# Patient Record
Sex: Female | Born: 1975 | Hispanic: Yes | Marital: Married | Smoking: Never smoker
Health system: Southern US, Community
[De-identification: ages and names within clinical notes are randomized; demographics above are authoritative.]

## PROBLEM LIST (undated history)

## (undated) ENCOUNTER — Emergency Department (HOSPITAL_BASED_OUTPATIENT_CLINIC_OR_DEPARTMENT_OTHER): Payer: PRIVATE HEALTH INSURANCE | Source: Home / Self Care

## (undated) DIAGNOSIS — Z862 Personal history of diseases of the blood and blood-forming organs and certain disorders involving the immune mechanism: Secondary | ICD-10-CM

## (undated) DIAGNOSIS — J302 Other seasonal allergic rhinitis: Secondary | ICD-10-CM

## (undated) DIAGNOSIS — Z98811 Dental restoration status: Secondary | ICD-10-CM

## (undated) DIAGNOSIS — H5010 Unspecified exotropia: Secondary | ICD-10-CM

## (undated) HISTORY — PX: OVARIAN CYST SURGERY: SHX726

## (undated) HISTORY — PX: ORIF FEMUR FRACTURE: SHX2119

---

## 2013-04-16 DIAGNOSIS — H5010 Unspecified exotropia: Secondary | ICD-10-CM

## 2013-04-16 HISTORY — DX: Unspecified exotropia: H50.10

## 2013-05-02 ENCOUNTER — Encounter (HOSPITAL_BASED_OUTPATIENT_CLINIC_OR_DEPARTMENT_OTHER): Payer: Self-pay | Admitting: *Deleted

## 2013-05-04 NOTE — H&P (Signed)
  Date of examination:  04-13-13  Indication for surgery: To straighten the eyes and allow some binocularity, and excisional biopsy RLL lesion  Pertinent past medical history:  Past Medical History  Diagnosis Date  . Seasonal allergies   . 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 History of anemia     no current med.  . Dental crowns present     Pertinent ocular history:  XT since childhood.  RLL lesion ? duration  Pertinent family history: History reviewed. No pertinent family history.  General:  Healthy appearing patient in no distress.    Eyes:    Acuity cc OD 20/25  OS 20/20  External: Within normal limits x nonpigmented lesion RLL  Anterior segment: Within normal limits     Motility:   XT = 50 comitant.  XT' 50.  Rots nl  Fundus: deferred  Refraction:   Manifest  OD -3 approx  OS -1.50 approx  Heart: Regular rate and rhythm without murmur     Lungs: Clear to auscultation     Abdomen: Soft, nontender, normal bowel sounds     Impression:1. Exotropia  2. Nonpigmented RLL lesion, R/O BCC  Plan: 1. LR recess OU  2. Excisional bx RLL lesion.  Explained some lashes may be lost permanently  Tera Pellicane O

## 2013-05-06 ENCOUNTER — Encounter (HOSPITAL_BASED_OUTPATIENT_CLINIC_OR_DEPARTMENT_OTHER): Payer: Self-pay | Admitting: *Deleted

## 2013-05-06 ENCOUNTER — Encounter (HOSPITAL_BASED_OUTPATIENT_CLINIC_OR_DEPARTMENT_OTHER): Payer: PRIVATE HEALTH INSURANCE | Admitting: Anesthesiology

## 2013-05-06 ENCOUNTER — Ambulatory Visit (HOSPITAL_BASED_OUTPATIENT_CLINIC_OR_DEPARTMENT_OTHER): Payer: PRIVATE HEALTH INSURANCE | Admitting: Anesthesiology

## 2013-05-06 ENCOUNTER — Encounter (HOSPITAL_BASED_OUTPATIENT_CLINIC_OR_DEPARTMENT_OTHER)
Admission: RE | Disposition: A | Discharge status: Home / Self Care | Payer: Self-pay | Source: Ambulatory Visit | Attending: Ophthalmology

## 2013-05-06 ENCOUNTER — Ambulatory Visit (HOSPITAL_BASED_OUTPATIENT_CLINIC_OR_DEPARTMENT_OTHER)
Admission: RE | Admit: 2013-05-06 | Discharge: 2013-05-06 | Disposition: A | Discharge status: Home / Self Care | Payer: PRIVATE HEALTH INSURANCE | Source: Ambulatory Visit | Attending: Ophthalmology | Admitting: Ophthalmology

## 2013-05-06 DIAGNOSIS — D2310 Other benign neoplasm of skin of unspecified eyelid, including canthus: Secondary | ICD-10-CM | POA: Insufficient documentation

## 2013-05-06 DIAGNOSIS — H5010 Unspecified exotropia: Secondary | ICD-10-CM | POA: Insufficient documentation

## 2013-05-06 HISTORY — DX: Personal history of diseases of the blood and blood-forming organs and certain disorders involving the immune mechanism: Z86.2

## 2013-05-06 HISTORY — DX: Unspecified exotropia: H50.10

## 2013-05-06 HISTORY — DX: Other seasonal allergic rhinitis: J30.2

## 2013-05-06 HISTORY — DX: Dental restoration status: Z98.811

## 2013-05-06 HISTORY — PX: LID LESION EXCISION: SHX5204

## 2013-05-06 HISTORY — PX: STRABISMUS SURGERY: SHX218

## 2013-05-06 LAB — POCT HEMOGLOBIN-HEMACUE: Hemoglobin: 8.6 g/dL — ABNORMAL LOW (ref 12.0–15.0)

## 2013-05-06 SURGERY — STRABISMUS SURGERY, BILATERAL
Anesthesia: General | Site: Eye | Laterality: Right | Wound class: Clean

## 2013-05-06 MED ORDER — LACTATED RINGERS IV SOLN
INTRAVENOUS | Status: DC
  Administered 2013-05-06: 09:00:00 via INTRAVENOUS

## 2013-05-06 MED ORDER — OXYCODONE HCL 5 MG/5ML PO SOLN: 5.0000 mg | Freq: Once | ORAL | Status: DC | PRN

## 2013-05-06 MED ORDER — OXYCODONE-ACETAMINOPHEN 7.5-325 MG PO TABS: 1.0000 | ORAL_TABLET | ORAL | Status: DC | PRN

## 2013-05-06 MED ORDER — FENTANYL CITRATE 0.05 MG/ML IJ SOLN
INTRAMUSCULAR | Status: AC
  Filled 2013-05-06: qty 4

## 2013-05-06 MED ORDER — PROPOFOL 10 MG/ML IV BOLUS
INTRAVENOUS | Status: DC | PRN
  Administered 2013-05-06: 150 mg via INTRAVENOUS

## 2013-05-06 MED ORDER — MIDAZOLAM HCL 5 MG/5ML IJ SOLN
INTRAMUSCULAR | Status: DC | PRN
  Administered 2013-05-06: 2 mg via INTRAVENOUS

## 2013-05-06 MED ORDER — TOBRAMYCIN-DEXAMETHASONE 0.3-0.1 % OP OINT
TOPICAL_OINTMENT | OPHTHALMIC | Status: DC | PRN
  Administered 2013-05-06: 1 via OPHTHALMIC

## 2013-05-06 MED ORDER — MIDAZOLAM HCL 2 MG/2ML IJ SOLN: 1.0000 mg | INTRAMUSCULAR | Status: DC | PRN

## 2013-05-06 MED ORDER — TOBRAMYCIN-DEXAMETHASONE 0.3-0.1 % OP SUSP
OPHTHALMIC | Status: AC
  Filled 2013-05-06: qty 2.5

## 2013-05-06 MED ORDER — MIDAZOLAM HCL 2 MG/2ML IJ SOLN
INTRAMUSCULAR | Status: AC
  Filled 2013-05-06: qty 2

## 2013-05-06 MED ORDER — TOBRAMYCIN-DEXAMETHASONE 0.3-0.1 % OP OINT: 1.0000 "application " | TOPICAL_OINTMENT | Freq: Two times a day (BID) | OPHTHALMIC | Status: DC

## 2013-05-06 MED ORDER — LIDOCAINE HCL (CARDIAC) 20 MG/ML IV SOLN
INTRAVENOUS | Status: DC | PRN
  Administered 2013-05-06: 50 mg via INTRAVENOUS

## 2013-05-06 MED ORDER — OXYCODONE HCL 5 MG PO TABS: 5.0000 mg | ORAL_TABLET | Freq: Once | ORAL | Status: DC | PRN

## 2013-05-06 MED ORDER — FENTANYL CITRATE 0.05 MG/ML IJ SOLN: 50.0000 ug | INTRAMUSCULAR | Status: DC | PRN

## 2013-05-06 MED ORDER — ONDANSETRON HCL 4 MG/2ML IJ SOLN
INTRAMUSCULAR | Status: DC | PRN
  Administered 2013-05-06: 4 mg via INTRAVENOUS

## 2013-05-06 MED ORDER — FENTANYL CITRATE 0.05 MG/ML IJ SOLN
INTRAMUSCULAR | Status: DC | PRN
  Administered 2013-05-06: 100 ug via INTRAVENOUS

## 2013-05-06 MED ORDER — DEXAMETHASONE SODIUM PHOSPHATE 4 MG/ML IJ SOLN
INTRAMUSCULAR | Status: DC | PRN
  Administered 2013-05-06: 10 mg via INTRAVENOUS

## 2013-05-06 MED ORDER — HYDROMORPHONE HCL PF 1 MG/ML IJ SOLN
INTRAMUSCULAR | Status: AC
  Filled 2013-05-06: qty 1

## 2013-05-06 MED ORDER — HYDROMORPHONE HCL PF 1 MG/ML IJ SOLN
0.2500 mg | INTRAMUSCULAR | Status: DC | PRN
  Administered 2013-05-06: 0.5 mg via INTRAVENOUS
  Administered 2013-05-06: 0.25 mg via INTRAVENOUS

## 2013-05-06 MED ORDER — MEPERIDINE HCL 25 MG/ML IJ SOLN: 6.2500 mg | INTRAMUSCULAR | Status: DC | PRN

## 2013-05-06 MED ORDER — ATROPINE SULFATE 0.4 MG/ML IJ SOLN
INTRAMUSCULAR | Status: DC | PRN
  Administered 2013-05-06: .4 mg via INTRAVENOUS

## 2013-05-06 MED ORDER — KETOROLAC TROMETHAMINE 30 MG/ML IJ SOLN
INTRAMUSCULAR | Status: DC | PRN
  Administered 2013-05-06: 30 mg via INTRAVENOUS

## 2013-05-06 MED ORDER — ONDANSETRON HCL 4 MG/2ML IJ SOLN: 4.0000 mg | Freq: Once | INTRAMUSCULAR | Status: DC | PRN

## 2013-05-06 SURGICAL SUPPLY — 46 items
APPLICATOR COTTON TIP 6IN STRL (MISCELLANEOUS) ×12 IMPLANT
APPLICATOR DR MATTHEWS STRL (MISCELLANEOUS) ×3 IMPLANT
BANDAGE ADHESIVE 1X3 (GAUZE/BANDAGES/DRESSINGS) ×3 IMPLANT
BANDAGE COBAN STERILE 2 (GAUZE/BANDAGES/DRESSINGS) ×3 IMPLANT
BLADE SURG 15 STRL LF DISP TIS (BLADE) IMPLANT
BLADE SURG 15 STRL SS (BLADE)
CAUTERY EYE LOW TEMP 1300F FIN (OPHTHALMIC RELATED) ×3 IMPLANT
COVER MAYO STAND STRL (DRAPES) ×3 IMPLANT
COVER TABLE BACK 60X90 (DRAPES) ×3 IMPLANT
DECANTER SPIKE VIAL GLASS SM (MISCELLANEOUS) ×3 IMPLANT
DRAPE SURG 17X23 STRL (DRAPES) ×6 IMPLANT
DRAPE U-SHAPE 76X120 STRL (DRAPES) IMPLANT
ELECT NEEDLE TIP 2.8 STRL (NEEDLE) IMPLANT
ELECT REM PT RETURN 9FT ADLT (ELECTROSURGICAL)
ELECT REM PT RETURN 9FT PED (ELECTROSURGICAL)
ELECTRODE REM PT RETRN 9FT PED (ELECTROSURGICAL) IMPLANT
ELECTRODE REM PT RTRN 9FT ADLT (ELECTROSURGICAL) IMPLANT
GAUZE XEROFORM 1X8 LF (GAUZE/BANDAGES/DRESSINGS) IMPLANT
GLOVE BIO SURGEON STRL SZ 6.5 (GLOVE) ×3 IMPLANT
GLOVE BIOGEL M STRL SZ7.5 (GLOVE) ×6 IMPLANT
GOWN BRE IMP PREV XXLGXLNG (GOWN DISPOSABLE) ×3 IMPLANT
GOWN PREVENTION PLUS XLARGE (GOWN DISPOSABLE) ×3 IMPLANT
NEEDLE 27GAX1X1/2 (NEEDLE) ×3 IMPLANT
NS IRRIG 1000ML POUR BTL (IV SOLUTION) ×3 IMPLANT
PACK BASIN DAY SURGERY FS (CUSTOM PROCEDURE TRAY) ×3 IMPLANT
PAD EYE OVAL STERILE LF (GAUZE/BANDAGES/DRESSINGS) ×3 IMPLANT
PENCIL BUTTON HOLSTER BLD 10FT (ELECTRODE) IMPLANT
SHEET MEDIUM DRAPE 40X70 STRL (DRAPES) ×3 IMPLANT
SPEAR EYE SURG WECK-CEL (MISCELLANEOUS) ×6 IMPLANT
STRIP CLOSURE SKIN 1/4X4 (GAUZE/BANDAGES/DRESSINGS) IMPLANT
SUT 6 0 SILK T G140 8DA (SUTURE) IMPLANT
SUT ETHILON 6 0 P 1 (SUTURE) IMPLANT
SUT MERSILENE 6 0 S14 DA (SUTURE) IMPLANT
SUT PLAIN 6 0 TG1408 (SUTURE) IMPLANT
SUT SILK 4 0 C 3 735G (SUTURE) IMPLANT
SUT SILK 4 0 P 3 (SUTURE) IMPLANT
SUT VIC AB 4-0 P-3 18XBRD (SUTURE) IMPLANT
SUT VIC AB 4-0 P3 18 (SUTURE)
SUT VICRYL 6 0 S 28 (SUTURE) IMPLANT
SUT VICRYL ABS 6-0 S29 18IN (SUTURE) ×6 IMPLANT
SYR 3ML 18GX1 1/2 (SYRINGE) ×3 IMPLANT
SYR CONTROL 10ML LL (SYRINGE) ×3 IMPLANT
SYRINGE 10CC LL (SYRINGE) ×3 IMPLANT
TOWEL OR 17X24 6PK STRL BLUE (TOWEL DISPOSABLE) ×9 IMPLANT
TOWEL OR NON WOVEN STRL DISP B (DISPOSABLE) ×3 IMPLANT
TRAY DSU PREP LF (CUSTOM PROCEDURE TRAY) ×3 IMPLANT

## 2013-05-06 NOTE — Anesthesia Postprocedure Evaluation (Signed)
Anesthesia Post Note  Patient: Kristine Blackwell  Procedure(s) Performed: Procedure(s) (LRB): REPAIR STRABISMUS BILATERAL and excision of lesion right lower lid (Bilateral) LID LESION EXCISION right  (Right)  Anesthesia type: general  Patient location: PACU  Post pain: Pain level controlled  Post assessment: Patient's Cardiovascular Status Stable  Last Vitals:  Filed Vitals:   05/06/13 1155  BP: 132/87  Pulse: 72  Temp: 36.4 C  Resp: 16    Post vital signs: Reviewed and stable  Level of consciousness: sedated  Complications: No apparent anesthesia complications

## 2013-05-06 NOTE — Op Note (Signed)
05/06/2013  10:48 AM  PATIENT:  Kristine Blackwell  37 y.o. female  PRE-OPERATIVE DIAGNOSIS:  1.  Exotropia        2.  Neoplasm, right lower eyelid  POST-OPERATIVE DIAGNOSIS:  same  PROCEDURE: 1.  Lateral rectus muscle recession 9.0 mm both eyes  SURGEON:  Pasty Spillers.Maple Hudson, M.D.   ANESTHESIA:   general  COMPLICATIONS:None  DESCRIPTION OF PROCEDURE: The patient was taken to the operating room where She was identified by me. General anesthesia was induced without difficulty after placement of appropriate monitors. The patient was prepped and draped in standard sterile fashion. A lid speculum was placed in the right eye.  Through an inferotemporal fornix incision through conjunctiva and Tenon's fascia, the right lateral rectus muscle was engaged on a series of muscle hooks and cleared of its fascial attachments. The tendon was secured with a double-armed 6-0 Vicryl suture with a double locking bite at each border of the muscle, 1 mm from the insertion. The muscle was disinserted, and was reattached to sclera at a measured distance of 9.0 millimeters posterior to the original insertion, using direct scleral passes in crossed swords fashion.  The suture ends were tied securely after the position of the muscle had been checked and found to be accurate. Conjunctiva was closed with 2 6-0 Vicryl sutures.  The speculum was transferred to the left eye, where an identical procedure was performed, again effecting a 9.0 millimeters recession of the lateral rectus muscle.  The lesion in the right lower eyelid was inspected.  It was nonpigmented, measuring 4.0 mm horizontally and 2.9 mm vertically, along the lid margin, with several lashes growing out through it.  Its temporal margin was 2.8 mm from the lateral canthus.  It was grasped with 0.5 mm forceps and excised with #15 blade, sparing as many lashes as possible.  Hemostasis was achieved with ophthalmic cautery.  TobraDex ointment was placed in each eye. The  patient was awakened without difficulty and taken to the recovery room in stable condition, having suffered no intraoperative or immediate postoperative complications.  Pasty Spillers. Omarius Grantham M.D.    PATIENT DISPOSITION:  PACU - hemodynamically stable.

## 2013-05-06 NOTE — Anesthesia Preprocedure Evaluation (Signed)

## 2013-05-06 NOTE — Anesthesia Procedure Notes (Signed)
Procedure Name: LMA Insertion Performed by: Elvira Langston W Pre-anesthesia Checklist: Patient identified, Timeout performed, Emergency Drugs available, Suction available and Patient being monitored Patient Re-evaluated:Patient Re-evaluated prior to inductionOxygen Delivery Method: Circle system utilized Preoxygenation: Pre-oxygenation with 100% oxygen Intubation Type: IV induction Ventilation: Mask ventilation without difficulty LMA: LMA flexible inserted LMA Size: 4.0 Number of attempts: 1 Placement Confirmation: breath sounds checked- equal and bilateral and positive ETCO2 Tube secured with: Tape Dental Injury: Teeth and Oropharynx as per pre-operative assessment      

## 2013-05-06 NOTE — Progress Notes (Signed)
Kristine Blackwell here in pre op to interpret

## 2013-05-06 NOTE — Interval H&P Note (Signed)
History and Physical Interval Note:  05/06/2013 9:47 AM  Kristine Blackwell  has presented today for surgery, with the diagnosis of exotropia  The various methods of treatment have been discussed with the patient and family. After consideration of risks, benefits and other options for treatment, the patient has consented to  Procedure(s): REPAIR STRABISMUS BILATERAL and excision of lesion right lower lid (Bilateral) LID LESION EXCISION right  (Right) as a surgical intervention .  The patient's history has been reviewed, patient examined, no change in status, stable for surgery.  I have reviewed the patient's chart and labs.  Questions were answered to the patient's satisfaction.     [name]

## 2013-05-06 NOTE — Transfer of Care (Signed)
Immediate Anesthesia Transfer of Care Note  Patient: Kristine Blackwell  Procedure(s) Performed: Procedure(s) with comments: REPAIR STRABISMUS BILATERAL and excision of lesion right lower lid (Bilateral) LID LESION EXCISION right  (Right) - right lower  lid  Patient Location: PACU  Anesthesia Type:General  Level of Consciousness: awake and sedated  Airway & Oxygen Therapy: Patient Spontanous Breathing and Patient connected to face mask oxygen  Post-op Assessment: Report given to PACU RN and Post -op Vital signs reviewed and stable  Post vital signs: Reviewed and stable  Complications: No apparent anesthesia complications

## 2013-05-09 ENCOUNTER — Encounter (HOSPITAL_BASED_OUTPATIENT_CLINIC_OR_DEPARTMENT_OTHER): Payer: Self-pay | Admitting: Ophthalmology

## 2017-12-02 ENCOUNTER — Encounter (HOSPITAL_COMMUNITY): Payer: Self-pay

## 2017-12-02 ENCOUNTER — Other Ambulatory Visit: Payer: Self-pay

## 2017-12-02 ENCOUNTER — Emergency Department (HOSPITAL_COMMUNITY)
Admission: EM | Admit: 2017-12-02 | Discharge: 2017-12-02 | Disposition: A | Discharge status: Home / Self Care | Payer: No Typology Code available for payment source | Attending: Emergency Medicine | Admitting: Emergency Medicine

## 2017-12-02 ENCOUNTER — Emergency Department (HOSPITAL_COMMUNITY): Payer: No Typology Code available for payment source

## 2017-12-02 DIAGNOSIS — Y939 Activity, unspecified: Secondary | ICD-10-CM | POA: Diagnosis not present

## 2017-12-02 DIAGNOSIS — Y999 Unspecified external cause status: Secondary | ICD-10-CM | POA: Insufficient documentation

## 2017-12-02 DIAGNOSIS — S46812A Strain of other muscles, fascia and tendons at shoulder and upper arm level, left arm, initial encounter: Secondary | ICD-10-CM | POA: Diagnosis not present

## 2017-12-02 DIAGNOSIS — S39012A Strain of muscle, fascia and tendon of lower back, initial encounter: Secondary | ICD-10-CM | POA: Diagnosis not present

## 2017-12-02 DIAGNOSIS — Y929 Unspecified place or not applicable: Secondary | ICD-10-CM | POA: Insufficient documentation

## 2017-12-02 DIAGNOSIS — S299XXA Unspecified injury of thorax, initial encounter: Secondary | ICD-10-CM | POA: Diagnosis present

## 2017-12-02 LAB — POC URINE PREG, ED: Preg Test, Ur: NEGATIVE

## 2017-12-02 MED ORDER — ACETAMINOPHEN 325 MG PO TABS
650.0000 mg | ORAL_TABLET | Freq: Once | ORAL | Status: AC
  Administered 2017-12-02: 650 mg via ORAL
  Filled 2017-12-02: qty 2

## 2017-12-02 MED ORDER — IBUPROFEN 200 MG PO TABS
600.0000 mg | ORAL_TABLET | Freq: Once | ORAL | Status: AC
  Administered 2017-12-02: 600 mg via ORAL
  Filled 2017-12-02: qty 3

## 2017-12-02 NOTE — Discharge Instructions (Signed)
If you develop worsening pain or if you develop weakness or numbness in your arms or legs, or if you develop headache, chest pain, abdominal pain or other new/concerning symptoms then return to the ER for evaluation. Take ibuprofen and/or tylenol for pain.  Si experimenta un empeoramiento del dolor o si presenta debilidad o entumecimiento en los brazos o las piernas, o si presenta dolor de cabeza, dolor en el pecho, dolor abdominal u otros sntomas nuevos o relacionados, vuelva a la sala de emergencias para su evaluacin. Tome ibuprofeno y / o tylenol para Conservation officer, historic buildings.
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
## 2018-04-03 ENCOUNTER — Other Ambulatory Visit: Payer: Self-pay

## 2018-04-03 ENCOUNTER — Emergency Department (HOSPITAL_BASED_OUTPATIENT_CLINIC_OR_DEPARTMENT_OTHER)
Admission: EM | Admit: 2018-04-03 | Discharge: 2018-04-03 | Disposition: A | Discharge status: Home / Self Care | Payer: BLUE CROSS/BLUE SHIELD | Attending: Emergency Medicine | Admitting: Emergency Medicine

## 2018-04-03 ENCOUNTER — Encounter (HOSPITAL_BASED_OUTPATIENT_CLINIC_OR_DEPARTMENT_OTHER): Payer: Self-pay | Admitting: Emergency Medicine

## 2018-04-03 ENCOUNTER — Emergency Department (HOSPITAL_BASED_OUTPATIENT_CLINIC_OR_DEPARTMENT_OTHER): Payer: BLUE CROSS/BLUE SHIELD

## 2018-04-03 DIAGNOSIS — Z79899 Other long term (current) drug therapy: Secondary | ICD-10-CM | POA: Diagnosis not present

## 2018-04-03 DIAGNOSIS — O209 Hemorrhage in early pregnancy, unspecified: Secondary | ICD-10-CM | POA: Insufficient documentation

## 2018-04-03 DIAGNOSIS — D259 Leiomyoma of uterus, unspecified: Secondary | ICD-10-CM | POA: Diagnosis not present

## 2018-04-03 DIAGNOSIS — Z3A01 Less than 8 weeks gestation of pregnancy: Secondary | ICD-10-CM | POA: Insufficient documentation

## 2018-04-03 DIAGNOSIS — R8271 Bacteriuria: Secondary | ICD-10-CM

## 2018-04-03 DIAGNOSIS — Z1389 Encounter for screening for other disorder: Secondary | ICD-10-CM | POA: Diagnosis not present

## 2018-04-03 LAB — WET PREP, GENITAL
Clue Cells Wet Prep HPF POC: NONE SEEN
Sperm: NONE SEEN
Trich, Wet Prep: NONE SEEN
Yeast Wet Prep HPF POC: NONE SEEN

## 2018-04-03 LAB — CBC WITH DIFFERENTIAL/PLATELET
Abs Immature Granulocytes: 0.02 10*3/uL (ref 0.00–0.07)
Basophils Absolute: 0 10*3/uL (ref 0.0–0.1)
Basophils Relative: 1 %
Eosinophils Absolute: 0.2 10*3/uL (ref 0.0–0.5)
Eosinophils Relative: 4 %
HCT: 34.9 % — ABNORMAL LOW (ref 36.0–46.0)
Hemoglobin: 10.6 g/dL — ABNORMAL LOW (ref 12.0–15.0)
Immature Granulocytes: 0 %
Lymphocytes Relative: 25 %
Lymphs Abs: 1.6 10*3/uL (ref 0.7–4.0)
MCH: 24.3 pg — ABNORMAL LOW (ref 26.0–34.0)
MCHC: 30.4 g/dL (ref 30.0–36.0)
MCV: 79.9 fL — AB (ref 80.0–100.0)
Monocytes Absolute: 0.6 10*3/uL (ref 0.1–1.0)
Monocytes Relative: 9 %
Neutro Abs: 4.1 10*3/uL (ref 1.7–7.7)
Neutrophils Relative %: 61 %
PLATELETS: 295 10*3/uL (ref 150–400)
RBC: 4.37 MIL/uL (ref 3.87–5.11)
RDW: 23 % — ABNORMAL HIGH (ref 11.5–15.5)
WBC: 6.6 10*3/uL (ref 4.0–10.5)
nRBC: 0 % (ref 0.0–0.2)

## 2018-04-03 LAB — URINALYSIS, ROUTINE W REFLEX MICROSCOPIC
Bilirubin Urine: NEGATIVE
GLUCOSE, UA: NEGATIVE mg/dL
KETONES UR: NEGATIVE mg/dL
Leukocytes, UA: NEGATIVE
Nitrite: NEGATIVE
PH: 6 (ref 5.0–8.0)
Protein, ur: NEGATIVE mg/dL
Specific Gravity, Urine: 1.02 (ref 1.005–1.030)

## 2018-04-03 LAB — URINALYSIS, MICROSCOPIC (REFLEX)

## 2018-04-03 LAB — HCG, QUANTITATIVE, PREGNANCY: hCG, Beta Chain, Quant, S: 13151 m[IU]/mL — ABNORMAL HIGH (ref <5)

## 2018-04-03 LAB — PREGNANCY, URINE: Preg Test, Ur: POSITIVE — AB

## 2018-04-03 LAB — ABO/RH: ABO/RH(D): O POS

## 2018-04-03 NOTE — ED Provider Notes (Addendum)
[facility] EMERGENCY DEPARTMENT Provider Note   CSN: [id number] Arrival date & time: 04/03/18  1610     History   Chief Complaint Chief Complaint  Patient presents with  . Vaginal Bleeding    HPI Kristine Blackwell is a 42 y.o. female is here for evaluation of vaginal bleeding.  Patient states that she found out she was pregnant October 2 be a urine pregnancy test.  Her LMP was September 2.  This morning she had 2 episodes of pink-tinged secretions when she wiped after urinating.  She is not having a flow or passing clots or tissue.  Associate symptoms include midline thoracic back pain that began this morning as well, intermittent, worse with palpation.  No interventions PTA.  No alleviating or aggravating factors.  She is not on any birth control methods or IUDs.  This is patient's third pregnancy, she had a full-term delivery and a miscarriage.  She denies dysuria, hematuria, abdominal pain, abnormal vaginal discharge, nausea, vomiting, fevers. She has OBGYN and first appointment in November.   HPI  Past Medical History:  Diagnosis Date  . Dental crowns present   . 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 History of anemia    no current med.  . Seasonal allergies     There are no active problems to display for this patient.   Past Surgical History:  Procedure Laterality Date  . LID LESION EXCISION Right 05/06/2013   Procedure: LID LESION EXCISION right ;  Surgeon: Derry Skill, MD;  Location: [facility];  Service: Ophthalmology;  Laterality: Right;  right lower  lid  . ORIF FEMUR FRACTURE Left age 2  . OVARIAN CYST SURGERY  age 32  . STRABISMUS SURGERY Bilateral 05/06/2013   Procedure: REPAIR STRABISMUS BILATERAL and excision of lesion right lower lid;  Surgeon: Derry Skill, MD;  Location: [facility];  Service: Ophthalmology;  Laterality: Bilateral;     OB History    Gravida  1   Para      Term      Preterm      AB      Living        SAB      TAB      Ectopic      Multiple      Live Births               Home Medications    Prior to Admission medications   Medication Sig Start Date End Date Taking? Authorizing Provider  oxyCODONE-acetaminophen (PERCOCET) 7.5-325 MG per tablet Take 1 tablet by mouth every 4 (four) hours as needed for pain. Patient not taking: Reported on 12/02/2017 05/06/13   Everitt Amber, MD  tobramycin-dexamethasone [facility]) ophthalmic ointment Place 1 application into both eyes 2 (two) times daily. Patient not taking: Reported on 12/02/2017 05/06/13   Everitt Amber, MD    Family History History reviewed. No pertinent family history.  Social History Social History   Tobacco Use  . Smoking status: Never Smoker  . Smokeless tobacco: Never Used  Substance Use Topics  . Alcohol use: No  . Drug use: No     Allergies   Other   Review of Systems Review of Systems  Genitourinary: Positive for vaginal bleeding.  Musculoskeletal: Positive for back pain.  All other systems reviewed and are negative.    Physical Exam Updated Vital Signs BP 122/83 (BP Location: Right Arm)   Pulse 66   Temp 98.4  F (36.9 C) (Oral)   Resp 18   Ht 5\' 5"  (1.651 m)   Wt 74.8 kg   LMP 02/22/2018   SpO2 100%   BMI 27.46 kg/m   Physical Exam  Constitutional: She is oriented to person, place, and time. She appears well-developed and well-nourished. No distress.  NAD.  HENT:  Head: Normocephalic and atraumatic.  Right Ear: External ear normal.  Left Ear: External ear normal.  Nose: Nose normal.  Eyes: Conjunctivae and EOM are normal.  Neck: Normal range of motion. Neck supple.  Cardiovascular: Normal rate, regular rhythm and normal heart sounds.  Pulmonary/Chest: Effort normal and breath sounds normal.  Abdominal: Soft. Bowel sounds are normal. There is no tenderness.  No suprapubic or CVA tenderness.  Normal bowel sounds to lower quadrants.  Negative Murphy's and  McBurney's.  Genitourinary:  Genitourinary Comments:  External genitalia normal without lesions.  No groin lymphadenopathy.  Vaginal mucosa and cervix pink without lesions.  Scant amount of bright red blood in vaginal blood.  No CMT. Non palpable non tender adnexa. RN in room assisting with exam   Musculoskeletal: Normal range of motion. She exhibits tenderness. She exhibits no deformity.  T-spine: Mild left-sided paraspinal muscle tenderness.  No midline tenderness. L-spine: No midline or paraspinous muscle tenderness.  Neurological: She is alert and oriented to person, place, and time.  Skin: Skin is warm and dry. Capillary refill takes less than 2 seconds.  Psychiatric: She has a normal mood and affect. Her behavior is normal. Judgment and thought content normal.  Nursing note and vitals reviewed.    ED Treatments / Results  Labs (all labs ordered are listed, but only abnormal results are displayed) Labs Reviewed  WET PREP, GENITAL - Abnormal; Notable for the following components:      Result Value   WBC, Wet Prep HPF POC MANY (*)    All other components within normal limits  PREGNANCY, URINE - Abnormal; Notable for the following components:   Preg Test, Ur POSITIVE (*)    All other components within normal limits  URINALYSIS, ROUTINE W REFLEX MICROSCOPIC - Abnormal; Notable for the following components:   Color, Urine STRAW (*)    Hgb urine dipstick LARGE (*)    All other components within normal limits  URINALYSIS, MICROSCOPIC (REFLEX) - Abnormal; Notable for the following components:   Bacteria, UA FEW (*)    All other components within normal limits  CBC WITH DIFFERENTIAL/PLATELET - Abnormal; Notable for the following components:   Hemoglobin 10.6 (*)    HCT 34.9 (*)    MCV 79.9 (*)    MCH 24.3 (*)    RDW 23.0 (*)    All other components within normal limits  HCG, QUANTITATIVE, PREGNANCY - Abnormal; Notable for the following components:   hCG, Beta Chain, Quant, S  13,151 (*)    All other components within normal limits  ABO/RH  GC/CHLAMYDIA PROBE AMP ([address]) NOT AT [facility]    EKG None  Radiology US Ob Comp < 14 Wks  Result Date: 04/03/2018 CLINICAL DATA:  Mid lower back pain and vaginal spotting today. High risk pregnancy due to advanced maternal age. EXAM: OBSTETRIC <14 WK [place] AND TRANSVAGINAL OB US TECHNIQUE: Both transabdominal and transvaginal ultrasound examinations were performed for complete evaluation of the gestation as well as the maternal uterus, adnexal regions, and pelvic cul-de-sac. Transvaginal technique was performed to assess early pregnancy. COMPARISON:  None. FINDINGS: Intrauterine gestational sac: Single Yolk sac:  Visualized. Embryo:  Visualized. Cardiac Activity: Visualized. Heart Rate: 95 bpm CRL:  5.4 mm   6 w   2 d                  [place] EDC: 11/25/2018 Subchorionic hemorrhage: Small to moderate crescentic perigestational hematoma measuring 1.3 x 0.4 x 1.1 cm. Maternal uterus/adnexae: Hypoechoic left-sided uterine body mass measuring 1.6 x 1.1 x 1.1 cm may represent a left-sided fibroid. IMPRESSION: Viable 6 week 2 day gestation with ultrasound EDC of 11/25/2018. Adjacent perigestational hematoma measuring approximately 1.3 x 0.4 x 1.1 cm. Fibroid uterus. Electronically Signed   By: Ashley Royalty M.D.   On: 04/03/2018 19:47   US Ob Transvaginal  Result Date: 04/03/2018 CLINICAL DATA:  Mid lower back pain and vaginal spotting today. High risk pregnancy due to advanced maternal age. EXAM: OBSTETRIC <14 WK [place] AND TRANSVAGINAL OB US TECHNIQUE: Both transabdominal and transvaginal ultrasound examinations were performed for complete evaluation of the gestation as well as the maternal uterus, adnexal regions, and pelvic cul-de-sac. Transvaginal technique was performed to assess early pregnancy. COMPARISON:  None. FINDINGS: Intrauterine gestational sac: Single Yolk sac:  Visualized. Embryo:  Visualized. Cardiac Activity: Visualized. Heart Rate:  95 bpm CRL:  5.4 mm   6 w   2 d                  [place] EDC: 11/25/2018 Subchorionic hemorrhage: Small to moderate crescentic perigestational hematoma measuring 1.3 x 0.4 x 1.1 cm. Maternal uterus/adnexae: Hypoechoic left-sided uterine body mass measuring 1.6 x 1.1 x 1.1 cm may represent a left-sided fibroid. IMPRESSION: Viable 6 week 2 day gestation with ultrasound EDC of 11/25/2018. Adjacent perigestational hematoma measuring approximately 1.3 x 0.4 x 1.1 cm. Fibroid uterus. Electronically Signed   By: Ashley Royalty M.D.   On: 04/03/2018 19:47    Procedures Procedures (including critical care time)  Medications Ordered in ED Medications - No data to display   Initial Impression / Assessment and Plan / ED Course  I have reviewed the triage vital signs and the nursing notes.  Pertinent labs & imaging results that were available during my care of the patient were reviewed by me and considered in my medical decision making (see chart for details).  Clinical Course as of Apr 04 2027  Sat Apr 03, 2018  2008 IMPRESSION: Viable 6 week 2 day gestation with ultrasound EDC of 11/25/2018. Adjacent perigestational hematoma measuring approximately 1.3 x 0.4 x 1.1 cm. Fibroid uterus.    US OB Comp < 14 Wks [CG]    Clinical Course User Index [CG] Kinnie Feil, PA-C    Concern for miscarriage vs ectopic.  Pt is high risk pregnancy given age and previous h/o miscarriage. No h/o ectopic. No current birth control methods or IUD. She denies urinary symptoms, vaginal discharge, fevers, chills, abdominal pain making PID unlikely.  Negative murphys and mcburneys. Low risk sexual practices, given that she is asymptomatic will defer empiric std tx today.   2015: [place] confirms viable single IUP with adjacent hematoma.  H/o anemia with stable hgb,she is having low volume vaginal bleeding and HD stable. UA w/ few bacteria and WBC, will tx for symptomatic bacteriuria.  ABO/Rh just added, carrier in ER but pt needs  to leave. I will f/u on Rh status, if negative will call pt back and let her know she needs rhogam. Pt understands and is comfortable with this plan. First trimester pregnancy education provided and given. Discussed return precautions. She is to  f/u with OBGYN in 1 week.    Final Clinical Impressions(s) / ED Diagnoses   Final diagnoses:  First trimester bleeding  Uterine leiomyoma, unspecified location  Asymptomatic bacteriuria    ED Discharge Orders    None       Arlean Hopping 04/03/18 2028    Elnora Morrison, MD 04/03/18 2221  Addendum: Review of chart and AVS, did not send rx for asymptomatic bacteriuria. Attempted to send rx to pharmacy but unable to. Will contact patient tomorrow and call in rx.     Kinnie Feil, PA-C 04/04/18 1011    Elnora Morrison, MD 04/05/18 1036

## 2018-04-03 NOTE — ED Triage Notes (Signed)
Patient states she had her last period sept 9th - reports tat she took an OTC pregnancy test. The patient states that she is having some minor vaginal bleeding starting today

## 2018-04-03 NOTE — Discharge Instructions (Signed)
You were seen in the ER for vaginal bleeding. Ultrasound confirmed single viable pregnancy of approximate 6 weeks 2 days gestational age.  There is a small hematoma near the pregnancy.  You have one small fibroid. Avoid sexual intercourse until you are seen by your OBGYN.  Return to the ER if there is pelvic pain or abdominal pain, heavy vaginal bleeding, urinary symptoms, fevers, chills.  Ultrasonido confirm [employer] viable de aproximadamente 6 semanas 2 das de edad gestacional.  Hay un pequeo hematoma cerca del embarazo.  Tiene un fibroma pequeo. Evite relaciones sexuales hasta que su OBGYN la vea.  Regrese a Designer, fashion/clothing si hay dolor plvico o abdominal, sangrado vaginal abundante, sntomas urinarios, fiebres, escalofros.  Lista de medicinas que puede tomar durante embarazo para acido reflujo (heartburn) Aluminum hydroxide/magnesium carbonate (Gaviscon)* Famotidine (Pepcid AC) Aluminum hydroxide/magnesium hydroxide (Maalox) Calcium carbonate/magnesium carbonate (Mylanta) Calcium carbonate (Titralac, Tums) Ranitidine (Zantac)

## 2018-04-03 NOTE — ED Notes (Addendum)
Pt states that she is experiencing a "heartburn discomfort in stomach. It hurts in the middle and into my middle back. Pt states she has taken no medication. Pt states that she took a urine test at dr on Oct 2 or 3, 2019 and it was positive. SHe is now complaining of vaginal bleeding, and it is a light pink color.

## 2018-04-03 NOTE — ED Notes (Signed)
Patient transported to Ultrasound 

## 2018-04-03 NOTE — ED Notes (Signed)
ED Provider at bedside. 

## 2018-04-04 ENCOUNTER — Telehealth (HOSPITAL_BASED_OUTPATIENT_CLINIC_OR_DEPARTMENT_OTHER): Payer: Self-pay | Admitting: Emergency Medicine

## 2018-04-04 MED ORDER — NITROFURANTOIN MONOHYD MACRO 100 MG PO CAPS: 100.0000 mg | ORAL_CAPSULE | Freq: Two times a day (BID) | ORAL | 0 refills | Status: DC

## 2018-04-04 NOTE — Telephone Encounter (Signed)
Review of chart and AVS, rx for macrobid not sent to pharmacy prior discharge yesterday. Will call patient to notify and call rx in to pharmacy.

## 2018-04-05 LAB — GC/CHLAMYDIA PROBE AMP (~~LOC~~) NOT AT ARMC
CHLAMYDIA, DNA PROBE: NEGATIVE
Neisseria Gonorrhea: NEGATIVE

## 2018-07-15 ENCOUNTER — Emergency Department (HOSPITAL_COMMUNITY)
Admission: EM | Admit: 2018-07-15 | Discharge: 2018-07-15 | Disposition: A | Discharge status: Home / Self Care | Payer: BLUE CROSS/BLUE SHIELD | Attending: Emergency Medicine | Admitting: Emergency Medicine

## 2018-07-15 ENCOUNTER — Other Ambulatory Visit: Payer: Self-pay

## 2018-07-15 DIAGNOSIS — J45909 Unspecified asthma, uncomplicated: Secondary | ICD-10-CM | POA: Insufficient documentation

## 2018-07-15 DIAGNOSIS — R0602 Shortness of breath: Secondary | ICD-10-CM | POA: Diagnosis present

## 2018-07-15 MED ORDER — ALBUTEROL SULFATE (2.5 MG/3ML) 0.083% IN NEBU
5.0000 mg | INHALATION_SOLUTION | Freq: Once | RESPIRATORY_TRACT | Status: AC
  Administered 2018-07-15: 5 mg via RESPIRATORY_TRACT
  Filled 2018-07-15: qty 6

## 2018-07-15 MED ORDER — ALBUTEROL SULFATE HFA 108 (90 BASE) MCG/ACT IN AERS: 1.0000 | INHALATION_SPRAY | Freq: Four times a day (QID) | RESPIRATORY_TRACT | 0 refills | Status: DC | PRN

## 2018-07-15 NOTE — ED Notes (Signed)
Patient verbalizes understanding of discharge instructions. Opportunity for questioning and answers were provided. Armband removed by staff, pt discharged from ED. Interpreter used.

## 2018-07-15 NOTE — ED Provider Notes (Signed)
[facility] EMERGENCY DEPARTMENT Provider Note   CSN: [id number] Arrival date & time: 07/15/18  1558     History   Chief Complaint Chief Complaint  Patient presents with  . Asthma    HPI Kristine Blackwell is a 43 y.o. female with a PMH of asthma and environmental allergies presenting with constant shortness of breath onset yesterday after cleaning and vacuuming her house. Patient reports there was a lot of dust. Patient states she took allegra with some relief. Patient states she has had a dry cough since yesterday. Patient reports mild congestion. Patient reports associated intermittent bilateral thoracic back pain. Patient denies fever, chills, night sweats, chest pain, or trauma. Patient states she does not have an albuterol inhaler at home. Patient denies recent surgery, hx DVT/PE, recent travel, leg edema/pain. Denies numbness, tingling, weakness, incontinence to bowel/bladder, fever, chills, IV drug use, or hx of cancer. Patient denies sick contacts.   HPI  Past Medical History:  Diagnosis Date  . Dental crowns present   . 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 History of anemia    no current med.  . Seasonal allergies     There are no active problems to display for this patient.   Past Surgical History:  Procedure Laterality Date  . LID LESION EXCISION Right 05/06/2013   Procedure: LID LESION EXCISION right ;  Surgeon: Derry Skill, MD;  Location: [facility];  Service: Ophthalmology;  Laterality: Right;  right lower  lid  . ORIF FEMUR FRACTURE Left age 19  . OVARIAN CYST SURGERY  age 15  . STRABISMUS SURGERY Bilateral 05/06/2013   Procedure: REPAIR STRABISMUS BILATERAL and excision of lesion right lower lid;  Surgeon: Derry Skill, MD;  Location: [facility];  Service: Ophthalmology;  Laterality: Bilateral;     OB History    Gravida  1   Para      Term      Preterm      AB      Living        SAB      TAB        Ectopic      Multiple      Live Births               Home Medications    Prior to Admission medications   Medication Sig Start Date End Date Taking? Authorizing Provider  albuterol (PROVENTIL HFA;VENTOLIN HFA) 108 (90 Base) MCG/ACT inhaler Inhale 1-2 puffs into the lungs every 6 (six) hours as needed for wheezing or shortness of breath. 07/15/18   Darlin Drop P, PA-C  nitrofurantoin, macrocrystal-monohydrate, (MACROBID) 100 MG capsule Take 1 capsule (100 mg total) by mouth 2 (two) times daily. 04/04/18   Kinnie Feil, PA-C  oxyCODONE-acetaminophen (PERCOCET) 7.5-325 MG per tablet Take 1 tablet by mouth every 4 (four) hours as needed for pain. Patient not taking: Reported on 12/02/2017 05/06/13   Everitt Amber, MD  tobramycin-dexamethasone [facility]) ophthalmic ointment Place 1 application into both eyes 2 (two) times daily. Patient not taking: Reported on 12/02/2017 05/06/13   Everitt Amber, MD    Family History No family history on file.  Social History Social History   Tobacco Use  . Smoking status: Never Smoker  . Smokeless tobacco: Never Used  Substance Use Topics  . Alcohol use: No  . Drug use: No     Allergies   Other   Review of Systems Review of Systems  Constitutional: Negative for chills, diaphoresis, fatigue, fever and unexpected weight change.  HENT: Positive for congestion. Negative for rhinorrhea, sore throat and trouble swallowing.   Eyes: Negative for visual disturbance.  Respiratory: Positive for shortness of breath. Negative for cough, chest tightness, wheezing and stridor.   Cardiovascular: Negative for chest pain, palpitations and leg swelling.  Gastrointestinal: Negative for abdominal pain, nausea and vomiting.  Endocrine: Negative for cold intolerance and heat intolerance.  Genitourinary: Negative for dysuria.  Musculoskeletal: Positive for back pain. Negative for neck pain.  Skin: Negative for pallor and rash.   Allergic/Immunologic: Positive for environmental allergies. Negative for food allergies.  Neurological: Negative for dizziness, syncope, speech difficulty, weakness, light-headedness and numbness.  Psychiatric/Behavioral: The patient is not nervous/anxious.      Physical Exam Updated Vital Signs BP 122/86 (BP Location: Right Arm)   Pulse 81   Temp 98.4 F (36.9 C) (Oral)   Resp 19   Ht 5\' 5"  (1.651 m)   Wt 81.6 kg   LMP 02/22/2018   SpO2 96%   BMI 29.95 kg/m   Physical Exam Vitals signs and nursing note reviewed.  Constitutional:      General: She is not in acute distress.    Appearance: She is well-developed. She is not diaphoretic.  HENT:     Head: Normocephalic and atraumatic.     Right Ear: Tympanic membrane, ear canal and external ear normal.     Left Ear: Tympanic membrane, ear canal and external ear normal.     Nose: Nose normal. No congestion or rhinorrhea.     Mouth/Throat:     Mouth: Mucous membranes are moist.     Pharynx: No oropharyngeal exudate or posterior oropharyngeal erythema.  Eyes:     Conjunctiva/sclera: Conjunctivae normal.  Neck:     Musculoskeletal: Normal range of motion and neck supple.     Vascular: No JVD.  Cardiovascular:     Rate and Rhythm: Normal rate and regular rhythm.     Pulses: Normal pulses.          Radial pulses are 2+ on the right side and 2+ on the left side.       Dorsalis pedis pulses are 2+ on the right side and 2+ on the left side.     Heart sounds: Normal heart sounds. No murmur. No friction rub. No gallop.   Pulmonary:     Effort: Pulmonary effort is normal. No accessory muscle usage or respiratory distress.     Breath sounds: Normal breath sounds. No wheezing, rhonchi or rales.  Chest:     Chest wall: No tenderness.  Abdominal:     Palpations: Abdomen is soft.     Tenderness: There is no abdominal tenderness.  Musculoskeletal: Normal range of motion.     Cervical back: Normal. She exhibits normal range of motion,  no tenderness and no bony tenderness.     Thoracic back: Normal. She exhibits normal range of motion, no tenderness and no bony tenderness.     Lumbar back: Normal. She exhibits normal range of motion, no tenderness and no bony tenderness.     Comments: No skin changes noted. No midline tenderness to palpation of cervical, thoracic, or lumbar spine. Mild bilateral paraspinal tenderness to palpation of thoracic back. No paraspinal tenderness of lumbar or cervical spine. Full ROM without difficulty. Sensation intact. 5/5 strength in upper and lower extremities. Patient is able to ambulate without difficulty.  Skin:    General: Skin is warm.  Capillary Refill: Capillary refill takes less than 2 seconds.     Coloration: Skin is not pale.     Findings: No rash.  Neurological:     Mental Status: She is alert and oriented to person, place, and time.     ED Treatments / Results  Labs (all labs ordered are listed, but only abnormal results are displayed) Labs Reviewed - No data to display  EKG None  Radiology No results found.  Procedures Procedures (including critical care time)  Medications Ordered in ED Medications  albuterol (PROVENTIL) (2.5 MG/3ML) 0.083% nebulizer solution 5 mg (5 mg Nebulization Given 07/15/18 1659)     Initial Impression / Assessment and Plan / ED Course  I have reviewed the triage vital signs and the nursing notes.  Pertinent labs & imaging results that were available during my care of the patient were reviewed by me and considered in my medical decision making (see chart for details).    Patient presents with shortness of breath. Patient has been in no acute distress while in the ER. Patient was given albuterol with resolution of symptoms. Patient has been afebrile with cough for 2 days, do not suspect pneumonia at this time. Patient is also PERC negative. Suspect symptoms are likely due to an exacerbation of asthma due to the trigger of cleaning agents and  dust. Patient is at baseline after albuterol treatment. Patient also reports back pain.  No neurological deficits and normal neuro exam.  Patient can walk without difficulty.  No loss of bowel or bladder control.  No concern for cauda equina.  No fever, night sweats, weight loss, h/o cancer, IVDU.  Suspect back pain is likely musculoskeletal due to cleaning yesterday. RICE protocol and tylenol as needed discussed with patient. Discussed obtaining CXR and patient refuses at this time. Patient states she will return if symptoms worsen. Will prescribe albuterol.  Discussed return precautions with patient. Advised patient to follow up with PCP in 2 days. Patient states she understands and agrees with plan.   Final Clinical Impressions(s) / ED Diagnoses   Final diagnoses:  Shortness of breath    ED Discharge Orders         Ordered    albuterol (PROVENTIL HFA;VENTOLIN HFA) 108 (90 Base) MCG/ACT inhaler  Every 6 hours PRN     07/15/18 1749           Arville Lime, [place] 07/15/18 1752    Tegeler, Gwenyth Allegra, MD 07/15/18 [phone]

## 2018-07-15 NOTE — Discharge Instructions (Addendum)
You have been seen today for shortness of breath. Please read and follow all provided instructions.   1. Medications: albuterol for shortness of breath, usual home medications 2. Treatment: rest, drink plenty of fluids 3. Follow Up: Please follow up with your primary doctor in 2 days for discussion of your diagnoses and further evaluation after today's visit; if you do not have a primary care doctor use the resource guide provided to find one; Please return to the ER for any new or worsening symptoms. Please obtain all of your results from medical records or have your doctors office obtain the results - share them with your doctor - you should be seen at your doctors office. Call today to arrange your follow up.   Take medications as prescribed. Please review all of the medicines and only take them if you do not have an allergy to them. Return to the emergency room for worsening condition or new concerning symptoms. Follow up with your regular doctor. If you don't have a regular doctor use one of the numbers below to establish a primary care doctor.  Please be aware that if you are taking birth control pills, taking other prescriptions, ESPECIALLY ANTIBIOTICS may make the birth control ineffective - if this is the case, either do not engage in sexual activity or use alternative methods of birth control such as condoms until you have finished the medicine and your family doctor says it is OK to restart them. If you are on a blood thinner such as COUMADIN, be aware that any other medicine that you take may cause the coumadin to either work too much, or not enough - you should have your coumadin level rechecked in next 7 days if this is the case.  ?  It is also a possibility that you have an allergic reaction to any of the medicines that you have been prescribed - Everybody reacts differently to medications and while MOST people have no trouble with most medicines, you may have a reaction such as nausea,  vomiting, rash, swelling, shortness of breath. If this is the case, please stop taking the medicine immediately and contact your physician.  ?  You should return to the ER if you develop severe or worsening symptoms.   Emergency Department Resource Guide 1) Find a Doctor and Pay Out of Pocket Although you won't have to find out who is covered by your insurance plan, it is a good idea to ask around and get recommendations. You will then need to call the office and see if the doctor you have chosen will accept you as a new patient and what types of options they offer for patients who are self-pay. Some doctors offer discounts or will set up payment plans for their patients who do not have insurance, but you will need to ask so you aren't surprised when you get to your appointment.  2) Contact Your Local Health Department Not all health departments have doctors that can see patients for sick visits, but many do, so it is worth a call to see if yours does. If you don't know where your local health department is, you can check in your phone book. The CDC also has a tool to help you locate your state's health department, and many state websites also have listings of all of their local health departments.  3) Find a [facility] If your illness is not likely to be very severe or complicated, you may want to try a walk in clinic.  These are popping up all over the country in pharmacies, drugstores, and shopping centers. They're usually staffed by nurse practitioners or physician assistants that have been trained to treat common illnesses and complaints. They're usually fairly quick and inexpensive. However, if you have serious medical issues or chronic medical problems, these are probably not your best option.  No Primary Care Doctor: Call Health Connect at  [phone] - they can help you locate a primary care doctor that  accepts your insurance, provides certain services, etc. Physician Referral [phone]  Emergency Department Resource Guide 1) Find a Doctor and Pay Out of Pocket Although you won't have to find out who is covered by your insurance plan, it is a good idea to ask around and get recommendations. You will then need to call the office and see if the doctor you have chosen will accept you as a new patient and what types of options they offer for patients who are self-pay. Some doctors offer discounts or will set up payment plans for their patients who do not have insurance, but you will need to ask so you aren't surprised when you get to your appointment.  2) Contact Your Local Health Department Not all health departments have doctors that can see patients for sick visits, but many do, so it is worth a call to see if yours does. If you don't know where your local health department is, you can check in your phone book. The CDC also has a tool to help you locate your state's health department, and many state websites also have listings of all of their local health departments.  3) Find a [facility] If your illness is not likely to be very severe or complicated, you may want to try a walk in clinic. These are popping up all over the country in pharmacies, drugstores, and shopping centers. They're usually staffed by nurse practitioners or physician assistants that have been trained to treat common illnesses and complaints. They're usually fairly quick and inexpensive. However, if you have serious medical issues or chronic medical problems, these are probably not your best option.  No Primary Care Doctor: Call Health Connect at  [phone] - they can help you locate a primary care doctor that  accepts your insurance, provides certain services, etc. Physician Referral Service- [phone]  Chronic Pain Problems: Organization         Address  Phone   Notes  [facility]  [phone] Patients need to be referred by their primary care doctor.    Medication Assistance: Organization         Address  Phone   Notes  [facility] Medication [facility] [address] [place], [place] [address] [phone] --Must be a resident of [facility] -- Must have NO insurance coverage whatsoever (no Medicaid/ Medicare, etc.) -- The pt. MUST have a primary care doctor that directs their care regularly and follows them in the community   [facility]  [phone]   [employer]  [phone]    Agencies that provide inexpensive medical care: Organization         Address  Phone   Notes  [place]  [phone]   [facility]    [phone]   [facility] [place], [place] [address] [phone]   [place] [address], [place] [phone]   [facility]anned Parenthood    (  [phone]   [facility]    [phone]   [facility]  [address], [place] [place] Phone:  [phone], Fax:  [phone] Hours of Operation:  9 am - 6 pm, M-F.  Also accepts Medicaid/Medicare and self-pay.  [facility] for [place] [address], [address], [place] Phone: [phone], Fax: [phone]. Hours of Operation:  8:30 am - 5:30 pm, M-F.  Also accepts Medicaid and self-pay.  [facility] [place] [address], [place] Phone: [phone]   [place], [place], [place] [phone], Ext. [phone] Mondays & Thursdays: 7-9 AM.  First 15 patients are seen on a first come, first serve basis.    [place] Providers:  Organization         Address  Phone   Notes  [facility] [address], [place] [phone] Also accepts self-pay patients.  [facility] [address], [place]  [phone]   [place], [address], [place] [phone]   [facility] [address] [place] [phone]   Lucianne Lei [address], [place]   [phone] Only accepts [place] [facility] [place] patients after they have their name applied to their card.   Self-Pay (no insurance) in [facility]:  Organization         Address  Phone   Notes  Sickle Cell Patients, [facility] [place] [phone]   [facility] [place] [phone]   [facility] [place]  [place], [place], [place] [phone]   [facility]/Dr. Osei-Bonsu  [address], [place] or [address], [place] [phone] Phone number for both [place] and [place] locations is the same.  [facility] and [facility] [address], [place] [phone]   [facility] [address] [place] or [address] [phone]   [facility] [address] [place] [phone], phone; [phone], fax Sees patients 1st and 3rd Saturday of every month.  Must not qualify for public or private insurance (i.e. Medicaid, Medicare, [place] Health Choice, Veterans' Benefits)  Household income should be no more than 200% of the poverty level The clinic cannot treat you if you are pregnant or think you are pregnant  Sexually transmitted diseases are not treated at the clinic.

## 2018-07-15 NOTE — ED Triage Notes (Signed)
Pt arrives via POV with complaints of asthma exacerbation since deep cleaning her house yesterday, states she did not use a lot of chemicals but did a lot of vacuuming and dusting and has had trouble breathing deeply through her nose. Pt also complains of upper back pain. Pt in NAD during triage.
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
## 2020-09-27 DIAGNOSIS — Z20828 Contact with and (suspected) exposure to other viral communicable diseases: Secondary | ICD-10-CM | POA: Diagnosis not present

## 2020-09-27 DIAGNOSIS — Z1159 Encounter for screening for other viral diseases: Secondary | ICD-10-CM | POA: Diagnosis not present

## 2020-10-01 DIAGNOSIS — Z1159 Encounter for screening for other viral diseases: Secondary | ICD-10-CM | POA: Diagnosis not present

## 2020-10-01 DIAGNOSIS — Z20828 Contact with and (suspected) exposure to other viral communicable diseases: Secondary | ICD-10-CM | POA: Diagnosis not present

## 2020-10-11 DIAGNOSIS — Z20828 Contact with and (suspected) exposure to other viral communicable diseases: Secondary | ICD-10-CM | POA: Diagnosis not present

## 2020-10-11 DIAGNOSIS — Z1159 Encounter for screening for other viral diseases: Secondary | ICD-10-CM | POA: Diagnosis not present

## 2020-10-15 DIAGNOSIS — Z1159 Encounter for screening for other viral diseases: Secondary | ICD-10-CM | POA: Diagnosis not present

## 2020-10-15 DIAGNOSIS — Z20828 Contact with and (suspected) exposure to other viral communicable diseases: Secondary | ICD-10-CM | POA: Diagnosis not present

## 2020-10-25 DIAGNOSIS — R059 Cough, unspecified: Secondary | ICD-10-CM | POA: Diagnosis not present

## 2020-11-08 DIAGNOSIS — Z20828 Contact with and (suspected) exposure to other viral communicable diseases: Secondary | ICD-10-CM | POA: Diagnosis not present

## 2020-11-08 DIAGNOSIS — Z1159 Encounter for screening for other viral diseases: Secondary | ICD-10-CM | POA: Diagnosis not present

## 2020-11-12 DIAGNOSIS — Z20828 Contact with and (suspected) exposure to other viral communicable diseases: Secondary | ICD-10-CM | POA: Diagnosis not present

## 2020-11-12 DIAGNOSIS — Z1159 Encounter for screening for other viral diseases: Secondary | ICD-10-CM | POA: Diagnosis not present

## 2020-11-29 DIAGNOSIS — Z20828 Contact with and (suspected) exposure to other viral communicable diseases: Secondary | ICD-10-CM | POA: Diagnosis not present

## 2020-11-29 DIAGNOSIS — Z1159 Encounter for screening for other viral diseases: Secondary | ICD-10-CM | POA: Diagnosis not present

## 2020-12-03 DIAGNOSIS — Z20828 Contact with and (suspected) exposure to other viral communicable diseases: Secondary | ICD-10-CM | POA: Diagnosis not present

## 2020-12-03 DIAGNOSIS — Z1159 Encounter for screening for other viral diseases: Secondary | ICD-10-CM | POA: Diagnosis not present

## 2020-12-10 DIAGNOSIS — Z1159 Encounter for screening for other viral diseases: Secondary | ICD-10-CM | POA: Diagnosis not present

## 2020-12-10 DIAGNOSIS — Z20828 Contact with and (suspected) exposure to other viral communicable diseases: Secondary | ICD-10-CM | POA: Diagnosis not present

## 2020-12-24 DIAGNOSIS — Z20828 Contact with and (suspected) exposure to other viral communicable diseases: Secondary | ICD-10-CM | POA: Diagnosis not present

## 2020-12-24 DIAGNOSIS — Z1159 Encounter for screening for other viral diseases: Secondary | ICD-10-CM | POA: Diagnosis not present

## 2020-12-31 DIAGNOSIS — Z1159 Encounter for screening for other viral diseases: Secondary | ICD-10-CM | POA: Diagnosis not present

## 2020-12-31 DIAGNOSIS — Z20828 Contact with and (suspected) exposure to other viral communicable diseases: Secondary | ICD-10-CM | POA: Diagnosis not present

## 2021-01-03 DIAGNOSIS — Z20828 Contact with and (suspected) exposure to other viral communicable diseases: Secondary | ICD-10-CM | POA: Diagnosis not present

## 2021-01-03 DIAGNOSIS — Z1159 Encounter for screening for other viral diseases: Secondary | ICD-10-CM | POA: Diagnosis not present

## 2021-01-07 DIAGNOSIS — Z1159 Encounter for screening for other viral diseases: Secondary | ICD-10-CM | POA: Diagnosis not present

## 2021-01-07 DIAGNOSIS — Z20828 Contact with and (suspected) exposure to other viral communicable diseases: Secondary | ICD-10-CM | POA: Diagnosis not present

## 2021-01-14 DIAGNOSIS — Z20828 Contact with and (suspected) exposure to other viral communicable diseases: Secondary | ICD-10-CM | POA: Diagnosis not present

## 2021-01-14 DIAGNOSIS — Z1159 Encounter for screening for other viral diseases: Secondary | ICD-10-CM | POA: Diagnosis not present

## 2021-01-17 DIAGNOSIS — Z1159 Encounter for screening for other viral diseases: Secondary | ICD-10-CM | POA: Diagnosis not present

## 2021-01-17 DIAGNOSIS — Z20828 Contact with and (suspected) exposure to other viral communicable diseases: Secondary | ICD-10-CM | POA: Diagnosis not present

## 2021-03-20 DIAGNOSIS — Z1231 Encounter for screening mammogram for malignant neoplasm of breast: Secondary | ICD-10-CM | POA: Diagnosis not present

## 2021-08-21 DIAGNOSIS — R051 Acute cough: Secondary | ICD-10-CM | POA: Diagnosis not present

## 2021-08-21 DIAGNOSIS — J069 Acute upper respiratory infection, unspecified: Secondary | ICD-10-CM | POA: Diagnosis not present

## 2021-08-21 DIAGNOSIS — R509 Fever, unspecified: Secondary | ICD-10-CM | POA: Diagnosis not present

## 2021-08-21 DIAGNOSIS — Z20822 Contact with and (suspected) exposure to covid-19: Secondary | ICD-10-CM | POA: Diagnosis not present

## 2021-08-27 DIAGNOSIS — J189 Pneumonia, unspecified organism: Secondary | ICD-10-CM | POA: Diagnosis not present

## 2021-09-03 ENCOUNTER — Ambulatory Visit: Payer: Self-pay

## 2021-09-03 DIAGNOSIS — R0789 Other chest pain: Secondary | ICD-10-CM | POA: Diagnosis not present

## 2021-09-03 NOTE — Telephone Encounter (Addendum)
Interpreter ID Jacalyn Lefevre #[id number] ?Chief Complaint: chest pain ?Symptoms: chest pain that sharp pains that come and go, radiates to back, dizziness ?Frequency: 3-4 days ?Pertinent Negatives: Patient denies SOB ?Disposition: '[]'$ ED /'[x]'$ Urgent Care (no appt availability in office) / '[]'$ Appointment(In office/virtual)/ '[]'$  [facility]/ '[]'$ Home Care/ '[]'$ Refused Recommended Disposition /'[]'$ [facility]/ '[]'$  Follow-up with PCP ?Additional Notes: Pt was calling in for an appt at [facility] but advised no NPA for weeks out. Pt advised the ED but she states that she has to wait a long time at the ED and was asking if she can go to UC. I advised pt she can go to UC but they may advise her ED as well. Pt verbalized understanding and would go to UC first.  ? ?Reason for Disposition ? [1] Chest pain (or "angina") comes and goes AND [2] is happening more often (increasing in frequency) or getting worse (increasing in severity) (Exception: chest pains that last only a few seconds) ? ?Answer Assessment - Initial Assessment Questions ?1. LOCATION: "Where does it hurt?"   ?    Chest in the middle and L and R ?2. RADIATION: "Does the pain go anywhere else?" (e.g., into neck, jaw, arms, back) ?    back ?3. ONSET: "When did the chest pain begin?" (Minutes, hours or days)  ?    3-4 days ?4. PATTERN "Does the pain come and go, or has it been constant since it started?"  "Does it get worse with exertion?"  ?    Comes and goes but present currently ?6. SEVERITY: "How bad is the pain?"  (e.g., Scale 1-10; mild, moderate, or severe) ?   - MILD (1-3): doesn't interfere with normal activities  ?   - MODERATE (4-7): interferes with normal activities or awakens from sleep ?   - SEVERE (8-10): excruciating pain, unable to do any normal activities   ?    Sharp pains that comes and go, didn't rate a # ?10. OTHER SYMPTOMS: "Do you have any other symptoms?" (e.g., dizziness, nausea, vomiting, sweating, fever, difficulty breathing, cough) ?      Mild  dizziness for a secs ? ?Protocols used: Chest Pain-A-AH ? ?

## 2022-04-17 DIAGNOSIS — J00 Acute nasopharyngitis [common cold]: Secondary | ICD-10-CM | POA: Diagnosis not present

## 2022-04-17 DIAGNOSIS — R0981 Nasal congestion: Secondary | ICD-10-CM | POA: Diagnosis not present

## 2022-04-17 DIAGNOSIS — R051 Acute cough: Secondary | ICD-10-CM | POA: Diagnosis not present

## 2022-07-21 ENCOUNTER — Encounter (INDEPENDENT_AMBULATORY_CARE_PROVIDER_SITE_OTHER): Payer: Self-pay | Admitting: Primary Care

## 2022-07-21 ENCOUNTER — Other Ambulatory Visit (INDEPENDENT_AMBULATORY_CARE_PROVIDER_SITE_OTHER): Payer: Self-pay

## 2022-07-21 ENCOUNTER — Ambulatory Visit (INDEPENDENT_AMBULATORY_CARE_PROVIDER_SITE_OTHER): Payer: BC Managed Care – PPO | Admitting: Primary Care

## 2022-07-21 VITALS — BP 131/82 | HR 70 | Resp 16 | Ht 65.5 in | Wt 166.8 lb

## 2022-07-21 DIAGNOSIS — Z13220 Encounter for screening for lipoid disorders: Secondary | ICD-10-CM

## 2022-07-21 DIAGNOSIS — Z23 Encounter for immunization: Secondary | ICD-10-CM

## 2022-07-21 DIAGNOSIS — Z1159 Encounter for screening for other viral diseases: Secondary | ICD-10-CM | POA: Diagnosis not present

## 2022-07-21 DIAGNOSIS — Z114 Encounter for screening for human immunodeficiency virus [HIV]: Secondary | ICD-10-CM | POA: Diagnosis not present

## 2022-07-21 DIAGNOSIS — R5383 Other fatigue: Secondary | ICD-10-CM | POA: Diagnosis not present

## 2022-07-21 DIAGNOSIS — Z7689 Persons encountering health services in other specified circumstances: Secondary | ICD-10-CM

## 2022-07-21 DIAGNOSIS — R030 Elevated blood-pressure reading, without diagnosis of hypertension: Secondary | ICD-10-CM

## 2022-07-21 DIAGNOSIS — Z1231 Encounter for screening mammogram for malignant neoplasm of breast: Secondary | ICD-10-CM

## 2022-07-21 DIAGNOSIS — F418 Other specified anxiety disorders: Secondary | ICD-10-CM

## 2022-07-21 DIAGNOSIS — Z833 Family history of diabetes mellitus: Secondary | ICD-10-CM

## 2022-07-21 DIAGNOSIS — Z1211 Encounter for screening for malignant neoplasm of colon: Secondary | ICD-10-CM

## 2022-07-21 NOTE — Progress Notes (Unsigned)
New Patient Office Visit  Subjective    Patient ID: Kristine Blackwell, female    DOB: 1975/08/29  Age: 47 y.o. MRN: [id number]  CC: establish care   HPI Ms. Kristine Blackwell is a 47 year old Hispanic female ([place] [phone]) presents to establish care. She voices feels like electrician goes through her hear several time a day aggravating factors, insomnia, anxiety and depression- emotional. Patient has No headache, No chest pain, No abdominal pain - No Nausea, No new weakness tingling or numbness, No Cough - shortness of breath    Outpatient Encounter Medications as of 07/21/2022  Medication Sig   albuterol (PROVENTIL HFA;VENTOLIN HFA) 108 (90 Base) MCG/ACT inhaler Inhale 1-2 puffs into the lungs every 6 (six) hours as needed for wheezing or shortness of breath. (Patient not taking: Reported on 07/21/2022)   nitrofurantoin, macrocrystal-monohydrate, (MACROBID) 100 MG capsule Take 1 capsule (100 mg total) by mouth 2 (two) times daily. (Patient not taking: Reported on 07/21/2022)   oxyCODONE-acetaminophen (PERCOCET) 7.5-325 MG per tablet Take 1 tablet by mouth every 4 (four) hours as needed for pain. (Patient not taking: Reported on 12/02/2017)   tobramycin-dexamethasone (TOBRADEX) ophthalmic ointment Place 1 application into both eyes 2 (two) times daily. (Patient not taking: Reported on 12/02/2017)   No facility-administered encounter medications on file as of 07/21/2022.    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   Seasonal allergies     Past Surgical History:  Procedure Laterality Date   LID LESION EXCISION Right 05/06/2013   Procedure: LID LESION EXCISION right ;  Surgeon: Derry Skill, MD;  Location: [facility];  Service: Ophthalmology;  Laterality: Right;  right lower  lid   ORIF FEMUR FRACTURE Left age 63   OVARIAN CYST SURGERY  age 64   STRABISMUS SURGERY Bilateral  05/06/2013   Procedure: REPAIR STRABISMUS BILATERAL and excision of lesion right lower lid;  Surgeon: Derry Skill, MD;  Location: [facility];  Service: Ophthalmology;  Laterality: Bilateral;    No family history on file.  Social History   Socioeconomic History   Marital status: Married    Spouse name: Not on file   Number of children: Not on file   Years of education: Not on file   Highest education level: Not on file  Occupational History   Not on file  Tobacco Use   Smoking status: Never   Smokeless tobacco: Never  Vaping Use   Vaping Use: Never used  Substance and Sexual Activity   Alcohol use: No   Drug use: No   Sexual activity: Not on file  Other Topics Concern   Not on file  Social History Narrative   Not on file   Social Determinants of Health   Financial Resource Strain: Not on file  Food Insecurity: Not on file  Transportation Needs: Not on file  Physical Activity: Not on file  Stress: Not on file  Social Connections: Not on file  Intimate Partner Violence: Not on file    ROS Comprehensive ROS Pertinent positive and negative noted in HPI       Objective    Blood Pressure 131/82   Pulse 70   Respiration 16   Height 5' 5.5" (1.664 m)   Weight 166 lb 12.8 oz (75.7 kg)   Oxygen Saturation 96%   Body Mass Index  27.33 kg/m   Physical Exam Vitals reviewed.  Constitutional:      Appearance: Normal appearance.  HENT:     Head: Normocephalic.     Right Ear: Tympanic membrane normal.     Left Ear: Tympanic membrane normal.     Nose: Nose normal.  Eyes:     Extraocular Movements: Extraocular movements intact.     Pupils: Pupils are equal, round, and reactive to light.  Cardiovascular:     Rate and Rhythm: Normal rate.  Pulmonary:     Effort: Pulmonary effort is normal.     Breath sounds: Normal breath sounds.  Abdominal:     General: Bowel sounds are normal.     Palpations: Abdomen is soft.  Musculoskeletal:         General: Normal range of motion.     Cervical back: Normal range of motion.  Skin:    General: Skin is warm and dry.  Neurological:     Mental Status: She is alert and oriented to person, place, and time.  Psychiatric:        Mood and Affect: Mood normal.        Behavior: Behavior normal.        Thought Content: Thought content normal.        Assessment & Plan:  Diagnoses and all orders for this visit:  Need for immunization against influenza -     Flu Vaccine QUAD 41moIM (Fluarix, Fluzone & Alfiuria Quad PF)  Encounter to establish care  Cen cancer screening -     Ambulatory referral to Gastroenterology  Encounter for HCV screening test for low risk patient -     HCV Ab w Reflex to Quant PCR  Encounter for screening for HIV -     HCV Ab w Reflex to Quant PCR -     HIV Antibody (routine testing w rflx)  Elevated blood pressure reading in office without diagnosis of hypertension INFORMATION PROVIDED TO PREVENT htn -     CMP14+EGFR -     TSH + free T4  Depression with anxiety    REFER TO CSW -     Vitamin D, 25-hydroxy -     Vitamin B12 -     TSH + free T4  Fatigue, unspecified type -     CBC with Differential/Platelet -     Vitamin D, 25-hydroxy -     Vitamin B12 -     TSH + free T4  Family history of diabetes mellitus in mother -     Hemoglobin A1c  Lipid screening -     Lipid panel   Return in about 6 weeks (around 09/01/2022) for pap.   MKerin Perna NP

## 2022-07-21 NOTE — Patient Instructions (Signed)
Prevencin de la hipertensin Preventing Hypertension La hipertensin, tambin conocida como presin arterial alta, se produce cuando la sangre bombea en las arterias con demasiada fuerza. Las arterias son vasos sanguneos que transportan la sangre desde el corazn al resto del cuerpo. Con frecuencia, la hipertensin no causa sntomas hasta que la presin arterial es muy alta. Es importante que controle regularmente su presin arterial. Los cambios en la dieta y el estilo de vida pueden ayudar a prevenir la hipertensin y a Microbiologist mejor al mejorar su calidad de vida. Si ya tiene hipertensin, puede controlarla con cambios en la dieta y el estilo de vida y con medicamentos. Cmo puede afectarme esta enfermedad? Con el transcurso del [place], la hipertensin puede daar las arterias y Transport planner flujo de sangre hacia partes importantes del cuerpo que incluyen el cerebro, el corazn y los riones. Si mantiene su presin arterial en un nivel saludable, podr prevenir complicaciones como un infarto de miocardio, insuficiencia cardaca, un accidente cerebrovascular, insuficiencia renal y demencia vascular. Lamar? Una alimentacin poco saludable y la falta de actividad fsica pueden aumentar las probabilidades de tener presin arterial alta. Algunos otros factores de riesgo son los siguientes: Edad. El riesgo aumenta con la edad. Tener familiares que han tenido presin arterial alta. Tener ciertas afecciones, como problemas de tiroides. Tener sobrepeso u obesidad. Consumir cafena o alcohol en exceso. Consumir mucha grasa, azcar, caloras o sal (sodio) en su dieta. Fumar o consumir drogas ilegales. Tomar ciertos medicamentos, como antidepresivos, descongestivos, pldoras anticonceptivas y antiinflamatorios no esteroideos (AINE), como el ibuprofeno. Qu medidas puedo tomar para prevenir o controlar esta afeccin? Trabaje junto al mdico para desarrollar un plan de  prevencin de la hipertensin que funcione para usted. Es posible que lo deriven para que reciba asesoramiento sobre una dieta saludable y Kandace Blitz fsica. Siga su plan y Anola Gurney a todas las visitas de seguimiento. Cambios en la dieta Siga una dieta saludable. Esto puede comprender lo siguiente: Menor ingesta de sal (sodio). Pregntele al mdico cunto sodio puede consumir de forma segura. La recomendacin general es consumir menos de 1 cucharadita (2300 mg) de sodio por da. No agregue sal a las comidas. Opte por alimentos con bajo contenido de sodio cuando realice las compras o coma fuera de casa. Limite la cantidad de grasa en la dieta. Esto se puede lograr con [employer] o de bajo contenido de grasas e ingiriendo menor cantidad de carnes rojas. Coma ms frutas, verduras y cereales integrales. Establezca un objetivo para comer: 1 a 2 tazas de frutas y verduras frescas todos los das. 3 a 4 porciones de cereales [employer]. Evite los alimentos y las bebidas que tengan azcares agregados. Coma pescados que contengan grasas saludables (cidos grasos omega-3), como la caballa o el salmn. Si necesita implementar un plan de comidas saludable, pruebe la dieta DASH. Esta dieta tiene un alto contenido de frutas, verduras y Psychologist, prison and probation services. Incluye poca cantidad de sodio, carnes rojas y azcares agregados. DASH es la sigla en ingls de "Enfoques Alimentarios para Detener la Hipertensin". Cambios en el estilo de vida  Baje de peso si es necesario. Con tan solo bajar entre el 3 % y el 5 % del peso corporal, puede prevenir o Chief Technology Officer la hipertensin. Por ejemplo, si su peso actual es de 200 libras (91 kg), una prdida entre el 3 % y el 5 % de su peso significa perder entre 6 y 74 libras (2.7 a 4.5 kg). [place]dale al [employer]  recomiende una dieta y un plan de ejercicios para bajar de peso de forma segura. Ejerctate lo suficiente. Debe realizar al menos 150 minutos de ejercicios  de intensidad moderada todas las semanas. Puede realizar [employer] en sesiones cortas de ejercicios, varias veces al da, o puede realizar sesiones ms largas, pero menos veces por semana. Por ejemplo, puede realizar una caminata enrgica o andar en bicicleta durante 10 minutos, 3 veces al da, durante 5 das a la [place]. Encuentre maneras de reducir el estrs, como hacer ejercicios, Radio broadcast assistant, Conservation officer, nature o tomar una clase de yoga. Si necesita ayuda para reducir [employer] de estrs, consulte al mdico. No consuma ningn producto que contenga nicotina o tabaco. Estos productos incluyen cigarrillos, tabaco para Higher education careers adviser y aparatos de vapeo, como los Psychologist, sport and exercise. Las sustancias qumicas presentes en los productos con tabaco y nicotina elevan su presin arterial cada vez que los consume. Si necesita ayuda para dejar de consumir estos productos, consulte al mdico. Aprenda a medir su presin arterial en casa. Asegrese de Civil engineer, contracting su objetivo de presin arterial, como se lo haya indicado el mdico. Trate de dormir entre 7 y 9 horas todas las noches. Consumo de alcohol No beba alcohol si: Su mdico le indica no hacerlo. Est embarazada, puede estar embarazada o est tratando de [place]. Si bebe alcohol: Limite la cantidad que bebe a lo siguiente: De 0 a 1 medida por da para las mujeres. De 0 a 2 medidas por da para los hombres. Sepa cunta cantidad de alcohol hay en las bebidas que toma. En los [place], una medida equivale a una botella de cerveza de 12 oz (355 ml), un vaso de vino de 5 oz (148 ml) o un vaso de una bebida alcohlica de alta graduacin de 1 oz (44 ml). Medicamentos Adems de los cambios en la dieta y el estilo de vida, [place] mdico podr indicarle medicamentos para ayudarle a Sports coach su presin arterial. En general: Tal vez deba probar distintos medicamentos hasta encontrar el ms adecuado para usted. Quiz necesite tomar ms de un medicamento. Use los  medicamentos de venta libre y los recetados solamente como se lo haya indicado el mdico. Preguntas para hacerle al mdico Cul es mi presin arterial ideal? Cmo disminuyo mi riesgo de tener presin arterial alta? Cmo debo controlar mi presin arterial en casa? Dnde obtener 7 Su mdico puede ayudarle a prevenir la hipertensin y Theatre manager su presin arterial en un nivel saludable. Su hospital o comunidad locales tambin pueden proporcionarle servicios y programas de prevencin. La American Heart Association (Asociacin Estadounidense del Corazn) ofrece un red de ayuda en lnea en supportnetwork.heart.org Dnde obtener ms informacin Obtenga ms informacin sobre la hipertensin en: Water engineer, Lung, and Blood Institute ([place], los Pulmones y Herbalist): https://wilson-eaton.com/ Centers for Disease Control and Prevention (Centros para el Control y [place]): http://www.wolf.info/ American Academy of Family Physicians ([place]): Patent attorney.org Obtenga ms informacin sobre la dieta DASH en: [employer], Lung, and Blood Institute ([place], los Pulmones y Herbalist): https://wilson-eaton.com/ Comunquese con un mdico si: Piensa que tiene una reaccin alrgica a los medicamentos que ha tomado. Tiene mareos o dolores de cabeza con Scientist, research (physical sciences). Tiene hinchazn en los tobillos. Tiene problemas de visin. Solicite ayuda de inmediato si: Tiene un dolor o Tree surgeon repentino o intenso en el pecho, la espalda o el abdomen. Le falta el aire. Tienes un dolor de cabeza repentino e intenso. Estos sntomas pueden  indicar Engineer, maintenance (IT). Solicite ayuda de inmediato. Llame al 911. No espere a ver si los sntomas desaparecen. No conduzca por sus propios medios [employer] hospital. Resumen La hipertensin con frecuencia no provoca sntomas hasta que la presin arterial es muy alta. Es importante que controle  regularmente su presin arterial. Los cambios en la dieta y el estilo de vida son pasos importantes para prevenir la hipertensin. Si mantiene su presin arterial en un nivel saludable, podr prevenir complicaciones como un infarto de miocardio, insuficiencia cardaca, un accidente cerebrovascular e insuficiencia renal. Trabaje junto al mdico para desarrollar un plan de prevencin de la hipertensin que funcione para usted. Esta informacin no tiene Marine scientist el consejo del mdico. Asegrese de hacerle al mdico cualquier pregunta que tenga. Document Revised: 04/17/2021 Document Reviewed: 04/17/2021 Elsevier Patient Education  [place].

## 2022-07-22 ENCOUNTER — Other Ambulatory Visit (INDEPENDENT_AMBULATORY_CARE_PROVIDER_SITE_OTHER): Payer: Self-pay | Admitting: Primary Care

## 2022-07-22 LAB — CMP14+EGFR
ALT: 20 IU/L (ref 0–32)
AST: 21 IU/L (ref 0–40)
Albumin/Globulin Ratio: 1.6 (ref 1.2–2.2)
Albumin: 4.5 g/dL (ref 3.9–4.9)
Alkaline Phosphatase: 116 IU/L (ref 44–121)
BUN/Creatinine Ratio: 29 — ABNORMAL HIGH (ref 9–23)
BUN: 20 mg/dL (ref 6–24)
Bilirubin Total: 0.3 mg/dL (ref 0.0–1.2)
CO2: 24 mmol/L (ref 20–29)
Calcium: 9.7 mg/dL (ref 8.7–10.2)
Chloride: 104 mmol/L (ref 96–106)
Creatinine, Ser: 0.69 mg/dL (ref 0.57–1.00)
Globulin, Total: 2.9 g/dL (ref 1.5–4.5)
Glucose: 78 mg/dL (ref 70–99)
Potassium: 4.1 mmol/L (ref 3.5–5.2)
Sodium: 141 mmol/L (ref 134–144)
Total Protein: 7.4 g/dL (ref 6.0–8.5)
eGFR: 107 mL/min/{1.73_m2} (ref >59)

## 2022-07-22 LAB — HCV INTERPRETATION

## 2022-07-22 LAB — CBC WITH DIFFERENTIAL/PLATELET
Basophils Absolute: 0 10*3/uL (ref 0.0–0.2)
Basos: 0 %
EOS (ABSOLUTE): 0.2 10*3/uL (ref 0.0–0.4)
Eos: 3 %
Hematocrit: 41.1 % (ref 34.0–46.6)
Hemoglobin: 14 g/dL (ref 11.1–15.9)
Immature Grans (Abs): 0 10*3/uL (ref 0.0–0.1)
Immature Granulocytes: 0 %
Lymphocytes Absolute: 1.8 10*3/uL (ref 0.7–3.1)
Lymphs: 34 %
MCH: 30.5 pg (ref 26.6–33.0)
MCHC: 34.1 g/dL (ref 31.5–35.7)
MCV: 90 fL (ref 79–97)
Monocytes Absolute: 0.4 10*3/uL (ref 0.1–0.9)
Monocytes: 8 %
Neutrophils Absolute: 2.9 10*3/uL (ref 1.4–7.0)
Neutrophils: 55 %
Platelets: 267 10*3/uL (ref 150–450)
RBC: 4.59 x10E6/uL (ref 3.77–5.28)
RDW: 12.5 % (ref 11.7–15.4)
WBC: 5.2 10*3/uL (ref 3.4–10.8)

## 2022-07-22 LAB — HEMOGLOBIN A1C
Est. average glucose Bld gHb Est-mCnc: 105 mg/dL
Hgb A1c MFr Bld: 5.3 % (ref 4.8–5.6)

## 2022-07-22 LAB — LIPID PANEL
Chol/HDL Ratio: 4 ratio (ref 0.0–4.4)
Cholesterol, Total: 210 mg/dL — ABNORMAL HIGH (ref 100–199)
HDL: 52 mg/dL (ref >39)
LDL Chol Calc (NIH): 148 mg/dL — ABNORMAL HIGH (ref 0–99)
Triglycerides: 58 mg/dL (ref 0–149)
VLDL Cholesterol Cal: 10 mg/dL (ref 5–40)

## 2022-07-22 LAB — HIV ANTIBODY (ROUTINE TESTING W REFLEX): HIV Screen 4th Generation wRfx: NONREACTIVE

## 2022-07-22 LAB — VITAMIN B12: Vitamin B-12: 787 pg/mL (ref 232–1245)

## 2022-07-22 LAB — TSH+FREE T4
Free T4: 1.12 ng/dL (ref 0.82–1.77)
TSH: 1.58 u[IU]/mL (ref 0.450–4.500)

## 2022-07-22 LAB — VITAMIN D 25 HYDROXY (VIT D DEFICIENCY, FRACTURES): Vit D, 25-Hydroxy: 34.8 ng/mL (ref 30.0–100.0)

## 2022-07-22 LAB — HCV AB W REFLEX TO QUANT PCR: HCV Ab: NONREACTIVE

## 2022-07-22 MED ORDER — ATORVASTATIN CALCIUM 40 MG PO TABS: 40.0000 mg | ORAL_TABLET | Freq: Every day | ORAL | 1 refills | Status: AC

## 2022-07-25 ENCOUNTER — Telehealth: Payer: Self-pay | Admitting: Primary Care

## 2022-07-25 NOTE — Telephone Encounter (Signed)
Copied from [place]. Topic: General - Inquiry >> Jul 25, 2022  1:42 PM Kristine Blackwell wrote: Reason for CRM: Pt stated PCP sent an order for a mammogram. However, when they called her to schedule, she let them know that she feels a small lump on both breasts and was advised to call PCP and let her know to have her send an order for a diagnostic mammogram and then an ultrasound. Pt stated she is worried and has requested this be done as soon as possible.  Please advise.

## 2022-07-29 ENCOUNTER — Other Ambulatory Visit (INDEPENDENT_AMBULATORY_CARE_PROVIDER_SITE_OTHER): Payer: Self-pay | Admitting: Primary Care

## 2022-07-29 DIAGNOSIS — N63 Unspecified lump in unspecified breast: Secondary | ICD-10-CM

## 2022-07-29 NOTE — Telephone Encounter (Signed)
Pt is calling back to follow up on order requested order for a diagnostic mammogram and then an ultrasound. Pt stated she is worried and has requested this be done as soon as possible.   Please advise.

## 2022-07-29 NOTE — Telephone Encounter (Signed)
Will forward to provider  

## 2022-08-01 ENCOUNTER — Telehealth: Payer: Self-pay | Admitting: Emergency Medicine

## 2022-08-01 ENCOUNTER — Other Ambulatory Visit (INDEPENDENT_AMBULATORY_CARE_PROVIDER_SITE_OTHER): Payer: Self-pay | Admitting: Primary Care

## 2022-08-01 DIAGNOSIS — N63 Unspecified lump in unspecified breast: Secondary | ICD-10-CM

## 2022-08-01 NOTE — Telephone Encounter (Signed)
Copied from [place] [id number]. Topic: General - Other >> Aug 01, 2022  3:33 PM [place] B wrote: Reason for CRM: Seth Bake called wanting to know the past info of where patient had mammograms done. Please call back at ext [phone]

## 2022-08-04 NOTE — Telephone Encounter (Signed)
Returned call to Seth Bake and per the receptionist she was on another call. Just made receptionist aware to make Seth Bake aware that we seen pt as a new patient on 2/5 and she will need to reach out to the patient in regards to her any previous mammogram she has had. Per receptionist she will give message to Seth Bake and if she has any questions or concern she can give [place] a call

## 2022-08-13 DIAGNOSIS — J029 Acute pharyngitis, unspecified: Secondary | ICD-10-CM | POA: Diagnosis not present

## 2022-08-13 DIAGNOSIS — J069 Acute upper respiratory infection, unspecified: Secondary | ICD-10-CM | POA: Diagnosis not present

## 2022-08-27 ENCOUNTER — Encounter (INDEPENDENT_AMBULATORY_CARE_PROVIDER_SITE_OTHER): Payer: Self-pay

## 2022-09-05 ENCOUNTER — Other Ambulatory Visit (HOSPITAL_COMMUNITY)
Admission: RE | Admit: 2022-09-05 | Discharge: 2022-09-05 | Disposition: A | Discharge status: Home / Self Care | Payer: BC Managed Care – PPO | Source: Ambulatory Visit | Attending: Primary Care | Admitting: Primary Care

## 2022-09-05 ENCOUNTER — Encounter (INDEPENDENT_AMBULATORY_CARE_PROVIDER_SITE_OTHER): Payer: Self-pay | Admitting: Primary Care

## 2022-09-05 ENCOUNTER — Ambulatory Visit (INDEPENDENT_AMBULATORY_CARE_PROVIDER_SITE_OTHER): Payer: BC Managed Care – PPO | Admitting: Primary Care

## 2022-09-05 VITALS — BP 136/82 | HR 82 | Resp 16 | Wt 162.8 lb

## 2022-09-05 DIAGNOSIS — Z1211 Encounter for screening for malignant neoplasm of colon: Secondary | ICD-10-CM

## 2022-09-05 DIAGNOSIS — Z124 Encounter for screening for malignant neoplasm of cervix: Secondary | ICD-10-CM | POA: Insufficient documentation

## 2022-09-07 NOTE — Progress Notes (Signed)
  [facility] Patient name: Kristine Blackwell MRN [id number]  Date of birth: [birth year] Chief Complaint:   Gynecologic Exam  History of Present Illness:   Kristine Blackwell is a 47 y.o. G1P0 female being seen today for a routine well-woman exam.   CC:gyn  The current method of family planning is vasectomy.  No LMP recorded. Last pap 06/28/95.  . Family h/o breast cancer: No Family h/o colorectal cancer: No  Review of Systems:    Denies any headaches, blurred vision, fatigue, shortness of breath, chest pain, abdominal pain, abnormal vaginal discharge/itching/odor/irritation, problems with periods, bowel movements, urination, or intercourse unless otherwise stated above.  Pertinent History Reviewed:   Reviewed past medical,surgical, social and family history.  Reviewed problem list, medications and allergies.  Physical Assessment:   Vitals:   09/05/22 1011  BP: 136/82  Pulse: 82  Resp: 16  SpO2: 100%  Weight: 162 lb 12.8 oz (73.8 kg)  Body mass index is 26.68 kg/m.        Physical Examination:  General appearance - well appearing, and in no distress Mental status - alert, oriented to person, place, and time Psych:  She has a normal mood and affect Skin - warm and dry, normal color, no suspicious lesions noted Chest - effort normal, all lung fields clear to auscultation bilaterally Heart - normal rate and regular rhythm Neck:  midline trachea, no thyromegaly or nodules Breasts - breasts appear normal, masses bilateral ( fibroid cystic breast) , no skin or nipple changes or axillary nodes Educated patient on proper self breast examination and had patient to demonstrate SBE. Abdomen - soft, nontender, nondistended, no masses or organomegaly Pelvic-VULVA: normal appearing vulva with no masses, tenderness or lesions   VAGINA: normal appearing vagina with normal color and discharge, no lesions   CERVIX: normal appearing cervix without discharge  or lesions, no CMT UTERUS: uterus is felt to be normal size, shape, consistency and nontender  ADNEXA: No adnexal masses or tenderness noted. Extremities:  No swelling or varicosities noted  No results found for this or any previous visit (from the past 24 hour(s)).   Assessment & Plan:  Kristine Blackwell was seen today for gynecologic exam.  Diagnoses and all orders for this visit:  Cervical cancer screening -     Cervicovaginal ancillary only -     Cytology - PAP  [place] cancer screening -     Ambulatory referral to Gastroenterology   This note has been created with Dragon speech recognition software and smart Company secretary. Any transcriptional errors are unintentional.   Kerin Perna, NP 09/07/2022, 9:36 PM

## 2022-09-08 LAB — CERVICOVAGINAL ANCILLARY ONLY
Bacterial Vaginitis (gardnerella): NEGATIVE
Candida Glabrata: NEGATIVE
Candida Vaginitis: NEGATIVE
Chlamydia: NEGATIVE
Comment: NEGATIVE
Comment: NEGATIVE
Comment: NEGATIVE
Comment: NEGATIVE
Comment: NEGATIVE
Comment: NORMAL
Neisseria Gonorrhea: NEGATIVE
Trichomonas: NEGATIVE

## 2022-09-09 LAB — CYTOLOGY - PAP
Comment: NEGATIVE
Diagnosis: UNDETERMINED — AB
High risk HPV: NEGATIVE

## 2022-09-19 ENCOUNTER — Encounter: Payer: Self-pay | Admitting: Primary Care

## 2022-10-03 ENCOUNTER — Ambulatory Visit: Payer: BC Managed Care – PPO

## 2022-10-03 ENCOUNTER — Ambulatory Visit
Admission: RE | Admit: 2022-10-03 | Discharge: 2022-10-03 | Disposition: A | Discharge status: Home / Self Care | Payer: BC Managed Care – PPO | Source: Ambulatory Visit | Attending: Primary Care | Admitting: Primary Care

## 2022-10-03 ENCOUNTER — Other Ambulatory Visit (INDEPENDENT_AMBULATORY_CARE_PROVIDER_SITE_OTHER): Payer: Self-pay | Admitting: Primary Care

## 2022-10-03 DIAGNOSIS — N63 Unspecified lump in unspecified breast: Secondary | ICD-10-CM

## 2022-10-03 DIAGNOSIS — N644 Mastodynia: Secondary | ICD-10-CM | POA: Diagnosis not present

## 2022-10-03 DIAGNOSIS — R928 Other abnormal and inconclusive findings on diagnostic imaging of breast: Secondary | ICD-10-CM | POA: Diagnosis not present

## 2022-12-18 DIAGNOSIS — R350 Frequency of micturition: Secondary | ICD-10-CM | POA: Diagnosis not present

## 2022-12-18 DIAGNOSIS — Z6827 Body mass index (BMI) 27.0-27.9, adult: Secondary | ICD-10-CM | POA: Diagnosis not present

## 2022-12-18 DIAGNOSIS — M542 Cervicalgia: Secondary | ICD-10-CM | POA: Diagnosis not present

## 2022-12-18 DIAGNOSIS — M546 Pain in thoracic spine: Secondary | ICD-10-CM | POA: Diagnosis not present

## 2022-12-18 DIAGNOSIS — M545 Low back pain, unspecified: Secondary | ICD-10-CM | POA: Diagnosis not present

## 2022-12-18 DIAGNOSIS — R030 Elevated blood-pressure reading, without diagnosis of hypertension: Secondary | ICD-10-CM | POA: Diagnosis not present

## 2022-12-26 DIAGNOSIS — M4120 Other idiopathic scoliosis, site unspecified: Secondary | ICD-10-CM | POA: Diagnosis not present

## 2022-12-26 DIAGNOSIS — R030 Elevated blood-pressure reading, without diagnosis of hypertension: Secondary | ICD-10-CM | POA: Diagnosis not present

## 2022-12-26 DIAGNOSIS — E559 Vitamin D deficiency, unspecified: Secondary | ICD-10-CM | POA: Diagnosis not present

## 2022-12-26 DIAGNOSIS — R350 Frequency of micturition: Secondary | ICD-10-CM | POA: Diagnosis not present

## 2022-12-26 DIAGNOSIS — Z6828 Body mass index (BMI) 28.0-28.9, adult: Secondary | ICD-10-CM | POA: Diagnosis not present

## 2022-12-26 DIAGNOSIS — E782 Mixed hyperlipidemia: Secondary | ICD-10-CM | POA: Diagnosis not present

## 2023-01-01 DIAGNOSIS — M549 Dorsalgia, unspecified: Secondary | ICD-10-CM | POA: Diagnosis not present

## 2023-01-01 DIAGNOSIS — M419 Scoliosis, unspecified: Secondary | ICD-10-CM | POA: Diagnosis not present

## 2023-01-02 DIAGNOSIS — M5459 Other low back pain: Secondary | ICD-10-CM | POA: Diagnosis not present

## 2023-01-07 DIAGNOSIS — M5459 Other low back pain: Secondary | ICD-10-CM | POA: Diagnosis not present

## 2023-01-12 DIAGNOSIS — M5459 Other low back pain: Secondary | ICD-10-CM | POA: Diagnosis not present

## 2023-01-14 DIAGNOSIS — M5459 Other low back pain: Secondary | ICD-10-CM | POA: Diagnosis not present

## 2023-01-19 DIAGNOSIS — M5459 Other low back pain: Secondary | ICD-10-CM | POA: Diagnosis not present

## 2023-01-21 DIAGNOSIS — Z6828 Body mass index (BMI) 28.0-28.9, adult: Secondary | ICD-10-CM | POA: Diagnosis not present

## 2023-01-21 DIAGNOSIS — M419 Scoliosis, unspecified: Secondary | ICD-10-CM | POA: Diagnosis not present

## 2023-01-21 DIAGNOSIS — R030 Elevated blood-pressure reading, without diagnosis of hypertension: Secondary | ICD-10-CM | POA: Diagnosis not present

## 2023-01-22 DIAGNOSIS — M419 Scoliosis, unspecified: Secondary | ICD-10-CM | POA: Diagnosis not present

## 2023-01-22 DIAGNOSIS — Z6828 Body mass index (BMI) 28.0-28.9, adult: Secondary | ICD-10-CM | POA: Diagnosis not present

## 2023-01-22 DIAGNOSIS — M5416 Radiculopathy, lumbar region: Secondary | ICD-10-CM | POA: Diagnosis not present

## 2023-01-26 DIAGNOSIS — M5459 Other low back pain: Secondary | ICD-10-CM | POA: Diagnosis not present

## 2023-01-27 ENCOUNTER — Other Ambulatory Visit: Payer: Self-pay | Admitting: Physician Assistant

## 2023-01-27 DIAGNOSIS — M549 Dorsalgia, unspecified: Secondary | ICD-10-CM

## 2023-01-27 DIAGNOSIS — M5416 Radiculopathy, lumbar region: Secondary | ICD-10-CM

## 2023-02-02 DIAGNOSIS — M5459 Other low back pain: Secondary | ICD-10-CM | POA: Diagnosis not present

## 2023-02-05 ENCOUNTER — Ambulatory Visit
Admission: RE | Admit: 2023-02-05 | Discharge: 2023-02-05 | Disposition: A | Discharge status: Home / Self Care | Payer: BC Managed Care – PPO | Source: Ambulatory Visit | Attending: Physician Assistant | Admitting: Physician Assistant

## 2023-02-05 DIAGNOSIS — M5431 Sciatica, right side: Secondary | ICD-10-CM | POA: Diagnosis not present

## 2023-02-05 DIAGNOSIS — M5416 Radiculopathy, lumbar region: Secondary | ICD-10-CM

## 2023-02-05 DIAGNOSIS — M549 Dorsalgia, unspecified: Secondary | ICD-10-CM

## 2023-02-09 DIAGNOSIS — M5459 Other low back pain: Secondary | ICD-10-CM | POA: Diagnosis not present

## 2023-02-19 DIAGNOSIS — M419 Scoliosis, unspecified: Secondary | ICD-10-CM | POA: Diagnosis not present

## 2023-02-19 DIAGNOSIS — M549 Dorsalgia, unspecified: Secondary | ICD-10-CM | POA: Diagnosis not present

## 2023-02-19 DIAGNOSIS — Z6828 Body mass index (BMI) 28.0-28.9, adult: Secondary | ICD-10-CM | POA: Diagnosis not present

## 2023-02-19 DIAGNOSIS — M5416 Radiculopathy, lumbar region: Secondary | ICD-10-CM | POA: Diagnosis not present

## 2023-02-27 DIAGNOSIS — M5459 Other low back pain: Secondary | ICD-10-CM | POA: Diagnosis not present

## 2023-03-13 DIAGNOSIS — M5459 Other low back pain: Secondary | ICD-10-CM | POA: Diagnosis not present

## 2023-03-20 DIAGNOSIS — M5432 Sciatica, left side: Secondary | ICD-10-CM | POA: Diagnosis not present

## 2023-03-20 DIAGNOSIS — M5431 Sciatica, right side: Secondary | ICD-10-CM | POA: Diagnosis not present

## 2023-03-20 DIAGNOSIS — M5459 Other low back pain: Secondary | ICD-10-CM | POA: Diagnosis not present

## 2023-03-27 DIAGNOSIS — M5459 Other low back pain: Secondary | ICD-10-CM | POA: Diagnosis not present

## 2023-04-10 DIAGNOSIS — M5459 Other low back pain: Secondary | ICD-10-CM | POA: Diagnosis not present
